# Patient Record
Sex: Female | Born: 1955 | Hispanic: No | Smoking: Current every day smoker
Health system: Southern US, Community
[De-identification: ages and names within clinical notes are randomized; demographics above are authoritative.]

## PROBLEM LIST (undated history)

## (undated) ENCOUNTER — Emergency Department (HOSPITAL_COMMUNITY): Payer: 59 | Source: Home / Self Care

## (undated) DIAGNOSIS — R079 Chest pain, unspecified: Secondary | ICD-10-CM

## (undated) DIAGNOSIS — K279 Peptic ulcer, site unspecified, unspecified as acute or chronic, without hemorrhage or perforation: Secondary | ICD-10-CM

## (undated) DIAGNOSIS — F329 Major depressive disorder, single episode, unspecified: Secondary | ICD-10-CM

## (undated) DIAGNOSIS — F419 Anxiety disorder, unspecified: Secondary | ICD-10-CM

## (undated) DIAGNOSIS — E785 Hyperlipidemia, unspecified: Secondary | ICD-10-CM

## (undated) DIAGNOSIS — R002 Palpitations: Secondary | ICD-10-CM

## (undated) DIAGNOSIS — F32A Depression, unspecified: Secondary | ICD-10-CM

## (undated) DIAGNOSIS — K219 Gastro-esophageal reflux disease without esophagitis: Secondary | ICD-10-CM

## (undated) DIAGNOSIS — R609 Edema, unspecified: Secondary | ICD-10-CM

## (undated) DIAGNOSIS — J449 Chronic obstructive pulmonary disease, unspecified: Secondary | ICD-10-CM

## (undated) DIAGNOSIS — I10 Essential (primary) hypertension: Secondary | ICD-10-CM

## (undated) DIAGNOSIS — M1990 Unspecified osteoarthritis, unspecified site: Secondary | ICD-10-CM

## (undated) DIAGNOSIS — R0600 Dyspnea, unspecified: Secondary | ICD-10-CM

## (undated) DIAGNOSIS — I517 Cardiomegaly: Secondary | ICD-10-CM

## (undated) HISTORY — DX: Dyspnea, unspecified: R06.00

## (undated) HISTORY — DX: Palpitations: R00.2

## (undated) HISTORY — PX: TUBAL LIGATION: SHX77

## (undated) HISTORY — PX: CARDIAC CATHETERIZATION: SHX172

## (undated) HISTORY — DX: Anxiety disorder, unspecified: F41.9

## (undated) HISTORY — DX: Depression, unspecified: F32.A

## (undated) HISTORY — DX: Hyperlipidemia, unspecified: E78.5

## (undated) HISTORY — DX: Peptic ulcer, site unspecified, unspecified as acute or chronic, without hemorrhage or perforation: K27.9

## (undated) HISTORY — DX: Major depressive disorder, single episode, unspecified: F32.9

## (undated) HISTORY — DX: Chest pain, unspecified: R07.9

## (undated) HISTORY — DX: Unspecified osteoarthritis, unspecified site: M19.90

## (undated) HISTORY — DX: Edema, unspecified: R60.9

---

## 2000-11-25 ENCOUNTER — Emergency Department (HOSPITAL_COMMUNITY): Admission: EM | Admit: 2000-11-25 | Discharge: 2000-11-25 | Payer: Self-pay | Admitting: *Deleted

## 2000-11-26 ENCOUNTER — Encounter: Payer: Self-pay | Admitting: *Deleted

## 2001-06-06 ENCOUNTER — Encounter: Payer: Self-pay | Admitting: Emergency Medicine

## 2001-06-06 ENCOUNTER — Emergency Department (HOSPITAL_COMMUNITY): Admission: EM | Admit: 2001-06-06 | Discharge: 2001-06-06 | Payer: Self-pay | Admitting: Emergency Medicine

## 2003-03-12 ENCOUNTER — Emergency Department (HOSPITAL_COMMUNITY): Admission: EM | Admit: 2003-03-12 | Discharge: 2003-03-12 | Payer: Self-pay | Admitting: Emergency Medicine

## 2003-03-16 ENCOUNTER — Ambulatory Visit (HOSPITAL_COMMUNITY): Admission: RE | Admit: 2003-03-16 | Discharge: 2003-03-16 | Payer: Self-pay | Admitting: Internal Medicine

## 2007-02-01 ENCOUNTER — Emergency Department (HOSPITAL_COMMUNITY): Admission: EM | Admit: 2007-02-01 | Discharge: 2007-02-01 | Payer: Self-pay | Admitting: Emergency Medicine

## 2009-03-18 ENCOUNTER — Ambulatory Visit: Payer: Self-pay | Admitting: Internal Medicine

## 2009-07-27 ENCOUNTER — Emergency Department (HOSPITAL_COMMUNITY): Admission: EM | Admit: 2009-07-27 | Discharge: 2009-07-27 | Payer: Self-pay | Admitting: Emergency Medicine

## 2010-03-20 ENCOUNTER — Emergency Department (HOSPITAL_COMMUNITY)
Admission: EM | Admit: 2010-03-20 | Discharge: 2010-03-20 | Payer: Self-pay | Source: Home / Self Care | Admitting: Emergency Medicine

## 2010-05-09 LAB — BASIC METABOLIC PANEL
BUN: 16 mg/dL (ref 6–23)
CO2: 28 mEq/L (ref 19–32)
Calcium: 9.2 mg/dL (ref 8.4–10.5)
Creatinine, Ser: 0.84 mg/dL (ref 0.4–1.2)
GFR calc Af Amer: >60 mL/min (ref >60)
Glucose, Bld: 96 mg/dL (ref 70–99)

## 2010-05-09 LAB — URINALYSIS, ROUTINE W REFLEX MICROSCOPIC
Bilirubin Urine: NEGATIVE
Glucose, UA: NEGATIVE mg/dL
Hgb urine dipstick: NEGATIVE
Ketones, ur: NEGATIVE mg/dL
Nitrite: NEGATIVE
Protein, ur: NEGATIVE mg/dL
Specific Gravity, Urine: 1.031 — ABNORMAL HIGH (ref 1.005–1.030)
Urobilinogen, UA: 1 mg/dL (ref 0.0–1.0)
pH: 6 (ref 5.0–8.0)

## 2010-05-09 LAB — CBC
MCHC: 33 g/dL (ref 30.0–36.0)
Platelets: 149 10*3/uL — ABNORMAL LOW (ref 150–400)
RBC: 4.8 MIL/uL (ref 3.87–5.11)
RDW: 16.1 % — ABNORMAL HIGH (ref 11.5–15.5)

## 2010-05-09 LAB — POCT CARDIAC MARKERS
CKMB, poc: <1 ng/mL — ABNORMAL LOW (ref 1.0–8.0)
Myoglobin, poc: 44.3 ng/mL (ref 12–200)

## 2010-05-09 LAB — DIFFERENTIAL
Basophils Absolute: 0 10*3/uL (ref 0.0–0.1)
Basophils Relative: 0 % (ref 0–1)
Eosinophils Absolute: 0.1 10*3/uL (ref 0.0–0.7)
Neutro Abs: 2.1 10*3/uL (ref 1.7–7.7)
Neutrophils Relative %: 58 % (ref 43–77)

## 2010-05-26 ENCOUNTER — Emergency Department (HOSPITAL_COMMUNITY)
Admission: EM | Admit: 2010-05-26 | Discharge: 2010-05-26 | Disposition: A | Discharge status: Home / Self Care | Payer: Self-pay | Attending: Emergency Medicine | Admitting: Emergency Medicine

## 2010-05-26 ENCOUNTER — Emergency Department (HOSPITAL_COMMUNITY): Payer: Self-pay

## 2010-05-26 DIAGNOSIS — I10 Essential (primary) hypertension: Secondary | ICD-10-CM | POA: Insufficient documentation

## 2010-05-26 DIAGNOSIS — R072 Precordial pain: Secondary | ICD-10-CM | POA: Insufficient documentation

## 2010-05-26 DIAGNOSIS — R0609 Other forms of dyspnea: Secondary | ICD-10-CM | POA: Insufficient documentation

## 2010-05-26 DIAGNOSIS — R1013 Epigastric pain: Secondary | ICD-10-CM | POA: Insufficient documentation

## 2010-05-26 DIAGNOSIS — R110 Nausea: Secondary | ICD-10-CM | POA: Insufficient documentation

## 2010-05-26 DIAGNOSIS — R002 Palpitations: Secondary | ICD-10-CM | POA: Insufficient documentation

## 2010-05-26 DIAGNOSIS — R109 Unspecified abdominal pain: Secondary | ICD-10-CM | POA: Insufficient documentation

## 2010-05-26 DIAGNOSIS — R0989 Other specified symptoms and signs involving the circulatory and respiratory systems: Secondary | ICD-10-CM | POA: Insufficient documentation

## 2010-05-26 DIAGNOSIS — R0602 Shortness of breath: Secondary | ICD-10-CM | POA: Insufficient documentation

## 2010-05-26 LAB — POCT CARDIAC MARKERS
CKMB, poc: <1 ng/mL — ABNORMAL LOW (ref 1.0–8.0)
Myoglobin, poc: 48.7 ng/mL (ref 12–200)
Troponin i, poc: <0.05 ng/mL (ref 0.00–0.09)

## 2010-05-26 LAB — POCT I-STAT, CHEM 8
BUN: 19 mg/dL (ref 6–23)
Calcium, Ion: 1.09 mmol/L — ABNORMAL LOW (ref 1.12–1.32)
Chloride: 105 meq/L (ref 96–112)
Creatinine, Ser: 1 mg/dL (ref 0.4–1.2)
Glucose, Bld: 89 mg/dL (ref 70–99)
HCT: 42 % (ref 36.0–46.0)
Hemoglobin: 14.3 g/dL (ref 12.0–15.0)
Potassium: 3.9 meq/L (ref 3.5–5.1)
Sodium: 141 meq/L (ref 135–145)
TCO2: 26 mmol/L (ref 0–100)

## 2010-05-26 LAB — HEPATIC FUNCTION PANEL
ALT: 14 U/L (ref 0–35)
AST: 22 U/L (ref 0–37)
Albumin: 3.7 g/dL (ref 3.5–5.2)
Alkaline Phosphatase: 78 U/L (ref 39–117)
Bilirubin, Direct: 0.3 mg/dL (ref 0.0–0.3)
Indirect Bilirubin: 0.4 mg/dL (ref 0.3–0.9)
Total Bilirubin: 0.7 mg/dL (ref 0.3–1.2)
Total Protein: 6.8 g/dL (ref 6.0–8.3)

## 2010-05-26 LAB — LIPASE, BLOOD: Lipase: 17 U/L (ref 11–59)

## 2010-06-01 ENCOUNTER — Inpatient Hospital Stay (HOSPITAL_COMMUNITY)
Admission: EM | Admit: 2010-06-01 | Discharge: 2010-06-02 | DRG: 392 | Disposition: A | Discharge status: Home / Self Care | Payer: Self-pay | Attending: Internal Medicine | Admitting: Internal Medicine

## 2010-06-01 ENCOUNTER — Emergency Department (HOSPITAL_COMMUNITY): Payer: Self-pay

## 2010-06-01 DIAGNOSIS — J449 Chronic obstructive pulmonary disease, unspecified: Secondary | ICD-10-CM | POA: Diagnosis present

## 2010-06-01 DIAGNOSIS — I10 Essential (primary) hypertension: Secondary | ICD-10-CM | POA: Diagnosis present

## 2010-06-01 DIAGNOSIS — R072 Precordial pain: Secondary | ICD-10-CM

## 2010-06-01 DIAGNOSIS — F1210 Cannabis abuse, uncomplicated: Secondary | ICD-10-CM | POA: Diagnosis present

## 2010-06-01 DIAGNOSIS — J4489 Other specified chronic obstructive pulmonary disease: Secondary | ICD-10-CM | POA: Diagnosis present

## 2010-06-01 DIAGNOSIS — Z87891 Personal history of nicotine dependence: Secondary | ICD-10-CM

## 2010-06-01 DIAGNOSIS — K219 Gastro-esophageal reflux disease without esophagitis: Principal | ICD-10-CM | POA: Diagnosis present

## 2010-06-01 DIAGNOSIS — E669 Obesity, unspecified: Secondary | ICD-10-CM | POA: Diagnosis present

## 2010-06-01 LAB — POCT CARDIAC MARKERS: Troponin i, poc: <0.05 ng/mL (ref 0.00–0.09)

## 2010-06-01 LAB — DIFFERENTIAL
Basophils Absolute: 0 K/uL (ref 0.0–0.1)
Basophils Relative: 1 % (ref 0–1)
Eosinophils Absolute: 0.2 10*3/uL (ref 0.0–0.7)
Eosinophils Relative: 5 % (ref 0–5)
Lymphocytes Relative: 33 % (ref 12–46)
Lymphs Abs: 1.1 10*3/uL (ref 0.7–4.0)
Monocytes Absolute: 0.3 10*3/uL (ref 0.1–1.0)
Monocytes Relative: 10 % (ref 3–12)
Neutro Abs: 1.8 10*3/uL (ref 1.7–7.7)
Neutrophils Relative %: 52 % (ref 43–77)

## 2010-06-01 LAB — CARDIAC PANEL(CRET KIN+CKTOT+MB+TROPI)
CK, MB: 0.7 ng/mL (ref 0.3–4.0)
Relative Index: INVALID (ref 0.0–2.5)
Relative Index: 0.6 (ref 0.0–2.5)
Total CK: 127 U/L (ref 7–177)
Troponin I: <0.01 ng/mL (ref 0.00–0.06)

## 2010-06-01 LAB — COMPREHENSIVE METABOLIC PANEL
ALT: 16 U/L (ref 0–35)
AST: 16 U/L (ref 0–37)
Albumin: 3.9 g/dL (ref 3.5–5.2)
Alkaline Phosphatase: 80 U/L (ref 39–117)
CO2: 29 mEq/L (ref 19–32)
Chloride: 104 mEq/L (ref 96–112)
GFR calc Af Amer: >60 mL/min (ref >60)
GFR calc non Af Amer: 56 mL/min — ABNORMAL LOW (ref >60)
Potassium: 4.1 mEq/L (ref 3.5–5.1)
Total Bilirubin: 0.3 mg/dL (ref 0.3–1.2)

## 2010-06-01 LAB — CBC
HCT: 41.4 % (ref 36.0–46.0)
Hemoglobin: 13.5 g/dL (ref 12.0–15.0)
MCH: 27.5 pg (ref 26.0–34.0)
MCHC: 32.6 g/dL (ref 30.0–36.0)
MCV: 84.3 fL (ref 78.0–100.0)
Platelets: 150 K/uL (ref 150–400)
RBC: 4.91 MIL/uL (ref 3.87–5.11)
RDW: 15.3 % (ref 11.5–15.5)
WBC: 3.4 K/uL — ABNORMAL LOW (ref 4.0–10.5)

## 2010-06-01 LAB — COMPREHENSIVE METABOLIC PANEL WITH GFR
BUN: 16 mg/dL (ref 6–23)
Calcium: 9.2 mg/dL (ref 8.4–10.5)
Creatinine, Ser: 1.02 mg/dL (ref 0.4–1.2)
Glucose, Bld: 99 mg/dL (ref 70–99)
Sodium: 140 meq/L (ref 135–145)
Total Protein: 7.4 g/dL (ref 6.0–8.3)

## 2010-06-01 LAB — LIPASE, BLOOD: Lipase: 18 U/L (ref 11–59)

## 2010-06-02 DIAGNOSIS — R079 Chest pain, unspecified: Secondary | ICD-10-CM

## 2010-06-02 LAB — CBC
MCV: 83.9 fL (ref 78.0–100.0)
Platelets: 164 10*3/uL (ref 150–400)
RBC: 5.08 MIL/uL (ref 3.87–5.11)
RDW: 15.2 % (ref 11.5–15.5)
WBC: 4.3 10*3/uL (ref 4.0–10.5)

## 2010-06-02 LAB — LIPID PANEL
HDL: 72 mg/dL (ref >39)
Total CHOL/HDL Ratio: 3.3 RATIO
Triglycerides: 75 mg/dL (ref <150)

## 2010-06-02 LAB — PROTIME-INR: Prothrombin Time: 13.1 seconds (ref 11.6–15.2)

## 2010-06-02 LAB — D-DIMER, QUANTITATIVE: D-Dimer, Quant: <0.22 ug/mL-FEU (ref 0.00–0.48)

## 2010-06-02 LAB — CARDIAC PANEL(CRET KIN+CKTOT+MB+TROPI)
CK, MB: 0.8 ng/mL (ref 0.3–4.0)
Troponin I: <0.01 ng/mL (ref 0.00–0.06)

## 2010-06-03 NOTE — Discharge Summary (Signed)
Jill Newton, Jill Newton            ACCOUNT NO.:  [id number]  MEDICAL RECORD NO.:  [id number]           PATIENT TYPE:  I  LOCATION:  3735                         FACILITY:  [facility]  PHYSICIAN:  Isidor Holts, M.D.  DATE OF BIRTH:  1955/03/08  DATE OF ADMISSION:  06/01/2010 DATE OF DISCHARGE:  06/02/2010                              DISCHARGE SUMMARY   PRIMARY MD:  [facility].  DISCHARGE DIAGNOSES: 1. Noncardiac chest pain, likely secondary to GERD. 2. Hypertension. 3. Smoking history. 4. History of headaches. 5. Remote history of peptic ulcer disease. 6. COPD.  DISCHARGE MEDICATIONS: 1. Albuterol inhaler 2 puffs p.r.n. q.4 hourly for shortness of     breath. 2. Hydrochlorothiazide 12.5 mg p.o. q.a.m. 3. Migraine formula (APAP/caffeine) OTC 1-2 tablets p.o. p.r.n. t.i.d.     for headache. 4. Prilosec 20 mg p.o. daily.  PROCEDURES:  Chest x-ray, June 01, 2010, this showed no pneumonia. There was mild peribronchial thickening.  CONSULTATIONS:  Dr. Lewayne Bunting, cardiologist.  ADMISSION HISTORY:  As in H and P notes of June 01, 2010, dictated by Dr. Donnalee Curry. However, in brief, this is a 55 year old female, with known history of headaches, remote history of peptic ulcer, smoking history, COPD, moderate obesity, presenting with recurrent chest pain. She was admitted for further evaluation, investigation, and management.  CLINICAL COURSE: 1. Chest pain.  The patient presented as described above.  12-lead EKG     showed no acute ischemic changes.  Cardiac enzymes were cycled,     remained unelevated.  D-dimer was negative, at less than 0.22.     Chest x-ray showed only mild bronchitic changes.  Cardiology     consultation was kindly provided by Dr. Lewayne Bunting.  The patient     went to cardiac catheterization on June 02, 2010, and this showed     no evidence of significant coronary artery disease.  The patient     has been reassured accordingly.  She has been  commenced on Prilosec     for probable GERD.  2. Hypertension.  The patient's blood pressure was controlled     throughout the course of her hospitalization.  3. Smoking history.  The patient smokes approximately half packet of     cigarettes per day.  She was counseled appropriately and has     expressed some determination to quit.  4. History of chronic headaches.  This did not prove problematic     during the course of this hospitalization.  5. COPD.  The patient was asymptomatic from this viewpoint.  DISPOSITION:  The patient as of June 02, 2010 was asymptomatic.  There were no new issues.  She was considered clinically stable for discharge, and therefore discharged accordingly.  ACTIVITY:  As tolerated.  DIET:  Heart-healthy.  FOLLOWUP INSTRUCTIONS:  The patient will follow up with her primary MD at [facility].  She already has an appointment scheduled for Jul 11, 2010, and she has been strongly urged to keep this appointment.     Isidor Holts, M.D.     CO/MEDQ  D:  06/02/2010  T:  06/03/2010  Job:  [id number] cc:   Primary  MD at [facility]  Electronically Signed by Isidor Holts M.D. on 06/03/2010 04:25:21 PM

## 2010-06-09 NOTE — Cardiovascular Report (Signed)
  NAMEDAJANA, GEHRIG            ACCOUNT NO.:  [id number]  MEDICAL RECORD NO.:  [id number]           PATIENT TYPE:  LOCATION:                                 FACILITY:  PHYSICIAN:  Trinh Sanjose C. Eden Emms, MD, FACCDATE OF BIRTH:  04-Jun-1955  DATE OF PROCEDURE: DATE OF DISCHARGE:                           CARDIAC CATHETERIZATION   Coronary arteriography.  INDICATIONS:  Chest pain.  Standard heart catheterization was done from the left radial artery.  A JL-3.5 and a JL-4 catheter were used to engage the left main.  A standard JR-4 catheter was used to engage the right coronary artery.  The patient received 4 mg of Versed and 25 mcg of fentanyl.  She also received 3 mg of verapamil and 5000 units of heparin.  Left main coronary artery was normal.  Left anterior descending artery was normal.  First and second diagonal branch were normal.  Circumflex coronary artery was nondominant.  It was normal.  First and second obtuse marginal branches were normal.  The right coronary artery was dominant.  It was normal.  RAO ventriculography:  RAO ventriculography was normal.  EF was 65%. There is no gradient across the aortic valve and no MR.  LV pressure was 165/13.  Aortic pressure was 165/92.  IMPRESSION:  The patient has no significant coronary artery disease. Her chest pain would appear to be noncardiac in etiology.  She tolerated the procedure well.  At the end of the procedure, a TR band was placed with good hemostasis.    Noralyn Pick. Eden Emms, MD, [facility]    PCN/MEDQ  D:  06/02/2010  T:  06/02/2010  Job:  [id number]  cc:   Learta Codding, MD,FACC  Electronically Signed by Charlton Haws MD [facility] on 06/09/2010 11:31:18 AM

## 2010-06-12 NOTE — H&P (Signed)
Jill Newton, Jill Newton            ACCOUNT NO.:  [id number]  MEDICAL RECORD NO.:  [id number]           PATIENT TYPE:  I  LOCATION:  3735                         FACILITY:  [facility]  PHYSICIAN:  Kela Millin, M.D.DATE OF BIRTH:  [birth year]  DATE OF ADMISSION:  06/01/2010 DATE OF DISCHARGE:                             HISTORY & PHYSICAL   CHIEF COMPLAINT:  Chest pain.  HISTORY OF PRESENT ILLNESS:  The patient is a 55 year old obese black female smoker with past medical history significant for recently diagnosed hypertension, who presents with above complaints.  She describes the chest pain as pressure that feels like someone sitting on her chest, midsternal in location, a 10/10 in intensity at its worst, and associated with nausea but no vomiting.  She states that the pain radiates to her upper back area and is not associated with diaphoresis. Jill Newton states that she does not own a car for transportation and has to walk most places and reports that she has noted that when she walks to the store that brings on the chest pain.  When asked about the duration of the pain she states that usually it lasts until she is able to lie down and rest and then it goes away.  She admits to shortness of breath during exertion.  She admits to a cough productive of phlegm, denies fevers, dysuria, melena, diarrhea and no hematochezia.  Per the ED physician, the patient had recently been seen in the ED and had an EKG done then which did not show any acute ischemic changes.  Upon coming back today, she had another EKG done and was noted to have some T- wave inversions in V5 and V6, otherwise normal EKG.  Point-of-care markers in the ED were negative x1 and a chest x-ray showed no pneumonia, mild peribronchial thickening.  Her electrolytes were within normal limits and she was admitted for further evaluation and management.  PAST MEDICAL HISTORY.:  As above. 1. History of headaches. 2.  History of ulcers.  MEDICATIONS: 1. Hydrochlorothiazide - started last Monday. 2. Ibuprofen.  ALLERGIES/INTOLERANCES:  NKDA.  SOCIAL HISTORY:  Positive for tobacco half a pack per day for about 25 years, occasional alcohol, she admits to marijuana use about once in 1-2 weeks.  REVIEW OF SYSTEMS:  As per HPI, other review of systems negative.  PHYSICAL EXAMINATION:  GENERAL:  The patient is a middle-aged black female, in no apparent distress, obese. VITAL SIGNS:  Her temperature is 129/68 with a pulse of 63, respiratory rate of 18, O2 saturation of 100, temperature is 97.4. HEENT:  PERRL, EOMI, sclerae anicteric, moist mucous membranes and no oral exudates. NECK:  Supple, no adenopathy, no thyromegaly and no JVD. LUNGS:  Coarse breath sounds, moderate air movements, no wheezes. CARDIOVASCULAR:  Regular rate and rhythm.  Normal S1-S2. ABDOMEN:  Obese, soft, bowel sounds present, nontender, nondistended. No organomegaly and no masses palpable.  Epigastric tenderness present. EXTREMITIES:  - No cyanosis and no edema. NEURO:  - She is alert and oriented x3.  Cranial nerves II-XII grossly intact.  Nonfocal exam.  LABORATORY DATA:  As per HPI.  Also, the white cell  count is 3.4 with a hemoglobin of 13.5, hematocrit of 41.4, platelet count is 150, neutrophil count is 52%.  Sodium is 140 with a potassium of 4.1, chloride 104, CO2 of 29, glucose is 99, BUN 16, creatinine is 1.02, calcium is 9.2, total protein is 7.4, albumin is 3.9, AST is 16.  Her lipase is 18.  ASSESSMENT AND PLAN: 1. Chest pain - probable unstable angina, as discussed above the pain     worse with exertion and relieved with rest and has increased in     frequency over the past 3 weeks.  We will place on aspirin,     nitroglycerin, beta-blockers, therapeutic Lovenox.  I have     consulted Cardiology.  The patient noted to have epigastric     tenderness on exam and her ED records lists ibuprofen on her      medication list.  We will start the patient on a PPI as well.     Follow. 2. Hypertension - we will place her on beta-blocker for now, follow     and resume outpatient medications upon discharge if appropriate. 3. Tobacco abuse and marijuana abuse - the patient counseled to quit,     social service consult for community resources. 4. History of headaches - follow pain management, follow and treat     accordingly.     Kela Millin, M.D.     ACV/MEDQ  D:  06/01/2010  T:  06/02/2010  Job:  [id number]  Electronically Signed by Donnalee Curry M.D. on 06/12/2010 08:27:50 AM

## 2010-06-18 NOTE — Consult Note (Signed)
NAMESHAINNA, FAUX            ACCOUNT NO.:  [id number]  MEDICAL RECORD NO.:  [id number]           PATIENT TYPE:  I  LOCATION:  3735                         FACILITY:  [facility]  PHYSICIAN:  Learta Codding, MD,FACC DATE OF BIRTH:  Jun 12, 1955  DATE OF CONSULTATION:  06/01/2010 DATE OF DISCHARGE:                                CONSULTATION   REFERRING PHYSICIAN:  [facility], clinical.  REASON FOR CONSULTATION:  Evaluation of substernal chest pain, both with typical and atypical features.  HISTORY OF PRESENT ILLNESS:  The patient is a 55 year old obese African American female with about 20-year history of tobacco use and recently diagnosed approximately a week ago with hypertension, started on hydrochlorothiazide.  The patient states that over the last 3 weeks she has had progressive substernal chest pain which she describes as a tightness which is left sided but can sometimes also be sharp in nature along the left sternal edge of her breast.  Exertion at times makes it worse and she has experienced it at rest, however, also.  On physical examination, she has significant tenderness upon palpation of this area.  She does have a history of reflux and a history of peptic ulcer disease but states that her symptoms are distinctly different.  She feels herself fatiguing very easily and when she does her grocery shopping, she sometimes has to stop because of substernal chest pain.  There is also some pleuritic component to her symptoms, particularly today.  On a couple of times when she takes a deep breath, her pain seems to be worse.  Her pain was not relieved by nitroglycerin in the emergency room but rather gave her migraine headache.  She did get morphine which she did not particularly like but it did appear to ease up the pain.  She had no acute EKG changes and her point-of-care markers were within normal limits with a troponin of less than 0.01.  The patient currently is pain  free but is still complaining of a headache.  FAMILY HISTORY:  Notable for heart disease in her mother although the patient could not be very specific about it, only that she thought that she had heart disease in her 69s, may be early 35s.  There is also diabetes mellitus in the family on her father's side.  She has no siblings with coronary artery disease.  ALLERGIES:  CODEINE.  MEDICATIONS:  Used to take Excedrin before it was taken off the market. She takes over-the-counter migraine medications.  She has an albuterol inhaler, and she was recently started on hydrochlorothiazide 12.5 mg daily when she was seen in the emergency room a week ago.  SOCIAL HISTORY:  The patient smokes typically a pack a day but has cut down to several cigarettes a day.  She does not drink alcohol.  She is married and has three children.  REVIEW OF SYSTEMS:  The patient denies any fever, chills, or sweats. She reports headache.  She has some dyspnea on exertion but no orthopnea, PND, edema, or palpitations.  The remainder of 18-point review of systems is, otherwise, within normal limits.  PAST MEDICAL HISTORY:  Notable for migraine  headaches, notable for recently diagnosed hypertension a week ago in the emergency room, obesity, and history of peptic ulcer disease status post upper endoscopy.  PHYSICAL EXAMINATION:  VITAL SIGNS:  Blood pressure 118/26 with a heart rate of 57 beats per minute, temperature is 98.9, saturation 99% on room air. GENERAL:  An obese African American female, somewhat uncomfortable, complaining of a headache but no chest pain. HEENT:  [place]/AT.  PERRLA.  EOMI.  Oropharynx clear without erythema or exudate. NECK:  Supple and there are no carotid bruits.  JVD is 5 cm.  No lymphadenopathy. HEART:  Regular rate and rhythm.  Normal S1 and S2 and no pathological murmurs. LUNGS:  Clear breath sounds bilaterally. SKIN:  No rash or lesions. ABDOMEN:  Soft, nontender with no rebound  or guarding.  Good bowel sounds. GENITOURINARY AND RECTAL:  Deferred. EXTREMITIES:  No cyanosis, clubbing, or edema.  No rash or lesions or petechiae. MUSCULOSKELETAL:  No joint deformities or effusions. NEUROLOGIC:  Alert and oriented grossly, nonfocal. SKIN:  Warm and dry and peripheral pulses are intact, dorsalis pedis and posterior tibial pulses as well as femoral pulses bilaterally.  Chest x-ray, mild peribronchial thickening. EKG:  Heart rate 63 beats per minute, normal sinus rhythm with nonspecific ST-T wave changes but otherwise, no evidence of acute ischemic changes.  Normal intervals.  LABORATORY WORK:  Hemoglobin 13.5, hematocrit 41.4, white count 3.4, platelet count 150.  Sodium is 140, potassium is 4.1, BUN is 16, creatinine is 1.02, glucose is 99.  Troponin is less than 0.01.  PROBLEM LIST: 1. Substernal chest pain with typical and atypical features.     a.     Cardiac risk factors including hypertension, possible family      history, and tobacco use. 2. Differential diagnosis includes:     a.     Acute coronary syndrome/unstable angina.     b.     Rule pulmonary embolism, very unlikely with essentially low      Wells criteria.     c.     Tietze syndrome/musculoskeletal pain.     d.     Esophageal spasm with gastroesophageal reflux disease. 3. Migraine headaches.  PLAN: 1. The patient has both typical and atypical features for angina.  Her     pain appears to be worse on exertion, and there is some relief with     rest.  The symptoms have also been crescendo over the last 3 weeks     which has prompted an emergency room visit last week.  However,     nitroglycerin does not seem to resolve her pain.  There were no     acute EKG changes, and her first set of troponins were within     normal limits. 2. I had a long discussion with the patient about the best strategy to     approach her problem.  Although clinically I feel that she likely     has musculoskeletal  pain as I could clearly reproduce it by     palpation of her left sternal border.  The patient is not able to     tolerate much in the way of pain medications, particularly codeine     and now also morphine.  She is very concerned about these symptoms.     Also given her size, I feel an exercise Cardiolite study may be     equivocal and may lead to cumulative testing.  Therefore, I     discussed with  the patient the possibility of a cardiac     catheterization.  I carefully explained the risks involved with the     procedure and the potential benefits.  She was very comfortable     with this discussion and wants this problem resolved as soon as     possible as she is very concerned that she might have heart     disease.  She is willing to proceed with cardiac catheterization     which will be performed tomorrow. 3. The patient will get a D-dimer although her Wells criteria is very     low making pulmonary embolism extremely unlikely. 4. The patient has some nausea.  We will treat her with Zofran.  Also,     she can be given tramadol for her headache in conjunction with     Tylenol which is a potentiating combination for analgesia control. 5. Serial cardiac enzyme markers will be obtained in the interim, but     I do not think the patient needs to be started on heparin at this     point in time and also she does not need nitroglycerin as she is     pain free and this seemed to be causing her significant headaches.     In addition, she is relatively bradycardic and we will defer beta-     blocker at the present time.     Learta Codding, MD,FACC     GED/MEDQ  D:  06/01/2010  T:  06/02/2010  Job:  [id number]  Electronically Signed by Lewayne Bunting MDFACC on 06/18/2010 03:21:25 PM

## 2010-07-08 NOTE — Op Note (Signed)
NAME:  Jill Newton, Jill Newton                      ACCOUNT NO.:  [id number]   MEDICAL RECORD NO.:  [id number]                   PATIENT TYPE:  AMB   LOCATION:  DAY                                  FACILITY:  [facility]   PHYSICIAN:  R. Roetta Sessions, M.D.              DATE OF BIRTH:  28-Oct-1955   DATE OF PROCEDURE:  03/16/2003  DATE OF DISCHARGE:                                 OPERATIVE REPORT   PROCEDURE:  Colonoscopy with ileoscopy and biopsy.   INDICATIONS:  The patient is a 55 year old lady who suffered a bout of  abdominal cramping and a couple of days worth of nonbloody diarrhea followed  by gross blood per rectum with clots last week.  She has remained  hemodynamically stable.  She was seen in the ER last week.  Her hemoglobin  was 12.6.  Her symptoms tapered off rapidly.  Her hemoglobin is 11.5 today.  She had no passage of blood during her colonoscopy prep yesterday.  Colonoscopy is now being done to further evaluate her symptoms.  This  approach has been discussed with the patient at length.  The potential  risks, benefits and alternatives have been reviewed.  Please see  documentation in the medical record.   DESCRIPTION OF PROCEDURE:  Oxygen saturation, blood pressure, pulse and  respiration were monitored throughout the entire procedure.  Conscious  sedation with Versed 5 mg IV and Demerol 75 mg IV in divided doses.  The  instrument was the Olympus video tip adult colonoscope.   FINDINGS:  Digital rectal exam revealed no abnormalities.   ENDOSCOPIC FINDINGS:  Prep was marginal to adequate.  There was quite a bit  of semi-formed stool throughout the colon with some seeds which may exam  difficult and kept clotting the scope.   Rectum:  Examination of the rectum mucosa including retroflexion in the anal  verge revealed only anal papilla and internal hemorrhoids.  Colon:  Colonic mucosa was surveyed from the rectosigmoid junction thru the  left, transverse, right colon to the  area of the appendiceal orifice,  ileocecal valve and cecum.  These structures were well seen and photographed  for the record.  From this level, the scope was slowly withdrawn.  All  previously mentioned mucosal surfaces were again seen.  The terminal ileum  was intubated to 10 cm.  The terminal ileum appeared normal.  The only  colonic mucosal abnormality noted was in the area of the splenic flexure  over approximately 10-15 cm.  There were areas of patchy granulation to  superficial erosion.  There was no ulcer, polyp or other abnormality.  A  couple of the abnormal areas were biopsied for histologic study.  The  patient tolerated the procedure well and was reactive after endoscopy.   ASSESSMENT:  1. Anal papilla and internal hemorrhoids.  Otherwise normal rectum.  2. Patchy granularity, superficial erosions involving 15 cm segment of     mucosa spanning  the splenic flexure, biopsied (most consistent with     ischemic colitis).  3. The remainder of the colonic mucosa appeared normal.  Normal terminal     ileum.   IMPRESSION:  The patient had a recent bout of ischemic or segmental colitis.   RECOMMENDATIONS:  1. Stop smoking.  2. Unless she has recurrent symptoms, no further evaluation is warranted.  3. She should have another colonoscopy for colorectal cancer screening     purposes in 10 years.  4. She is to let me know if she has any future bowel problems.      ___________________________________________                                            Jonathon Bellows, M.D.   RMR/MEDQ  D:  03/16/2003  T:  03/16/2003  Job:  [id number]

## 2010-07-08 NOTE — Consult Note (Signed)
NAME:  Jill Newton, Jill Newton                      ACCOUNT NO.:  [id number]   MEDICAL RECORD NO.:  [id number]                   PATIENT TYPE:  EMS   LOCATION:  ED                                   FACILITY:  [facility]   PHYSICIAN:  R. Roetta Sessions, M.D.              DATE OF BIRTH:  [birth year]   DATE OF CONSULTATION:  03/13/2003  DATE OF DISCHARGE:  03/12/2003                                   CONSULTATION   PHYSICIAN REQUESTING CONSULTATION:  [facility].   REASON FOR CONSULTATION:  Bloody diarrhea.   HISTORY OF PRESENT ILLNESS:  Jill Newton is a 55 year old black female who woke  up at 2 a.m. day before yesterday with severe abdominal cramping followed by  bloody diarrhea.  Symptoms persisted for several hours and she eventually  presented to the emergency department.  She states that they did an NG tube  for gastric lavage, however, she is unsure whether the Gastroccult was  positive.  She had several bloody bowel movements while she was in the ED.  Blood work including LFTs, lipase and amylase were all normal.  She had a  hemoglobin of 12.6, hematocrit 38.4, WBC 6.9 and platelets 182,000.  She  tells me she has been taking lots of Excedrin chronically for migraine  headaches.  No recent antibiotic use or travel abroad.  Generally, she has  regular bowel movements but on diarrhea.  She denies any melena.  No fever,  chills or heartburn.  She has had a couple of episodes of nausea and  vomiting but no hematemesis.  Denies any dizziness, lightheadedness,  shortness of breath or chest pain.  At this point, her last bowel movement  was more than 12 hours but there was no blood noted at that time.  She  continues to have some abdominal cramping, although Bentyl seems to be  helping.   CURRENT MEDICATIONS:  1. Bentyl 10 mg 1 q.6 h. p.r.n., started yesterday.  2. Excedrin p.r.n. headache -- may take up to 6 daily.   ALLERGIES:  No known drug allergies.   PAST MEDICAL HISTORY:  1.  History of migraine headaches previously for many years.  She most     recently has seen Dr. Mila Homer. Sudie Bailey and was put on Imitrex,     although has not seen him in several years.  2. She has had tubal ligation.  3. History of bronchitis.   FAMILY HISTORY:  Mother died of CHF.   SOCIAL HISTORY:  She is widowed and has 3 children.  She is disabled.  She  has a __________ , according to her, because she is a widow.  She smokes a  half a pack to 1 pack of cigarettes daily.  She drinks alcohol maybe once  every 2 weeks.   REVIEW OF SYSTEMS:  Please see HPI for GI, for GENERAL, for CARDIOPULMONARY.   PHYSICAL EXAMINATION:  VITAL SIGNS:  Weight 208-1/2.  Height 5 foot 4  inches.  Temperature 97.5, blood pressure 118/80, pulse 64.  GENERAL:  A pleasant, well-nourished, well-developed black female in no  acute distress.  She appears to not feel well.  SKIN:  Skin warm and dry.  No jaundice.  HEENT:  Conjunctivae are pink.  Sclerae are nonicteric.  Oropharyngeal  mucosa moist and pink.  No lesions, erythema or exudate.  NECK:  No lymphadenopathy or thyromegaly.  CHEST:  Lungs clear to auscultation.  CARDIAC:  Exam reveals regular rate and rhythm, normal S1 and S2, no  murmurs, rubs, or gallops.  ABDOMEN:  Positive bowel sounds.  Soft, nondistended.  She has mild diffuse  lower abdominal tenderness with some increased tenderness in the left lower  quadrant/left mid-abdomen.  No rebound tenderness or guarding.  No  organomegaly or masses.  RECTAL:  Examination reveals small external hemorrhoid.  No masses in the  rectal vault.  Secretions are Hemoccult-positive.  EXTREMITIES:  No edema.   IMPRESSION:  1. Jill Newton is a 55 year old lady who developed acute-onset abdominal cramping     followed by bloody diarrhea.  Symptoms are improving at this point,     however, she did continue to have some abdominal discomfort.     Differential diagnosis includes ischemic colitis, infectious colitis.   I     would not suspect upper gastrointestinal bleed, given hemodynamic     stability.  However, having said that, she has used a significant amount     of Excedrin chronically.  If this colonoscopy is unremarkable, she will     need to have an upper endoscopy.  2. History of chronic migraine headaches, using excessive Excedrin.  3. Hemoccult-positive stool.   PLAN:  1. Colonoscopy plus-or-minus EGD.  2. No NSAIDs or aspirin use.  3. Bland diet.  4. Aciphex 20 mg p.o. daily, #20 samples given.  5. Vicodin 5/500 mg, #21, p.o. 4 times daily p.r.n. with 0 refills.  6. I have asked her to follow up with Dr. Sudie Bailey regarding migraine     headaches.     ________________________________________  ___________________________________________  Tana Coast, P.AJonathon Bellows, M.D.   LL/MEDQ  D:  03/13/2003  T:  03/13/2003  Job:  [id number]   cc:   R. Roetta Sessions, M.D.  [address]  [place]  [place] [address]  Fax: [phone]   Mila Homer. Sudie Bailey, M.D.  [address] [place], [place] [address]  Fax: [phone]   Myrtis Ser, M.D.

## 2010-11-28 LAB — CBC
HCT: 38.1
Hemoglobin: 12.6
MCHC: 33.1
MCV: 83.3
Platelets: 175
RDW: 16.4 — ABNORMAL HIGH

## 2010-11-28 LAB — POCT CARDIAC MARKERS: CKMB, poc: <1 — ABNORMAL LOW

## 2010-11-28 LAB — BASIC METABOLIC PANEL
BUN: 23
CO2: 26
Chloride: 108
Glucose, Bld: 99
Potassium: 3.9
Sodium: 138

## 2011-03-10 ENCOUNTER — Emergency Department (HOSPITAL_COMMUNITY): Payer: Self-pay

## 2011-03-10 ENCOUNTER — Emergency Department (HOSPITAL_COMMUNITY)
Admission: EM | Admit: 2011-03-10 | Discharge: 2011-03-10 | Disposition: A | Discharge status: Home / Self Care | Payer: Self-pay | Attending: Emergency Medicine | Admitting: Emergency Medicine

## 2011-03-10 ENCOUNTER — Other Ambulatory Visit: Payer: Self-pay

## 2011-03-10 ENCOUNTER — Encounter (HOSPITAL_COMMUNITY): Payer: Self-pay | Admitting: *Deleted

## 2011-03-10 DIAGNOSIS — J449 Chronic obstructive pulmonary disease, unspecified: Secondary | ICD-10-CM | POA: Insufficient documentation

## 2011-03-10 DIAGNOSIS — F17200 Nicotine dependence, unspecified, uncomplicated: Secondary | ICD-10-CM | POA: Insufficient documentation

## 2011-03-10 DIAGNOSIS — I10 Essential (primary) hypertension: Secondary | ICD-10-CM | POA: Insufficient documentation

## 2011-03-10 DIAGNOSIS — Z79899 Other long term (current) drug therapy: Secondary | ICD-10-CM | POA: Insufficient documentation

## 2011-03-10 DIAGNOSIS — R071 Chest pain on breathing: Secondary | ICD-10-CM | POA: Insufficient documentation

## 2011-03-10 DIAGNOSIS — R110 Nausea: Secondary | ICD-10-CM | POA: Insufficient documentation

## 2011-03-10 DIAGNOSIS — K219 Gastro-esophageal reflux disease without esophagitis: Secondary | ICD-10-CM | POA: Insufficient documentation

## 2011-03-10 DIAGNOSIS — J4489 Other specified chronic obstructive pulmonary disease: Secondary | ICD-10-CM | POA: Insufficient documentation

## 2011-03-10 HISTORY — DX: Gastro-esophageal reflux disease without esophagitis: K21.9

## 2011-03-10 HISTORY — DX: Essential (primary) hypertension: I10

## 2011-03-10 HISTORY — DX: Cardiomegaly: I51.7

## 2011-03-10 HISTORY — DX: Chronic obstructive pulmonary disease, unspecified: J44.9

## 2011-03-10 LAB — COMPREHENSIVE METABOLIC PANEL
AST: 15 U/L (ref 0–37)
Albumin: 3.3 g/dL — ABNORMAL LOW (ref 3.5–5.2)
Alkaline Phosphatase: 68 U/L (ref 39–117)
BUN: 14 mg/dL (ref 6–23)
CO2: 24 mEq/L (ref 19–32)
Chloride: 105 mEq/L (ref 96–112)
Creatinine, Ser: 0.81 mg/dL (ref 0.50–1.10)
GFR calc non Af Amer: 80 mL/min — ABNORMAL LOW (ref >90)
Potassium: 4.3 mEq/L (ref 3.5–5.1)
Total Bilirubin: 0.3 mg/dL (ref 0.3–1.2)

## 2011-03-10 LAB — CARDIAC PANEL(CRET KIN+CKTOT+MB+TROPI)
Relative Index: INVALID (ref 0.0–2.5)
Total CK: 88 U/L (ref 7–177)

## 2011-03-10 LAB — CBC
HCT: 39.2 % (ref 36.0–46.0)
Hemoglobin: 13 g/dL (ref 12.0–15.0)
MCV: 84.7 fL (ref 78.0–100.0)
WBC: 3.2 10*3/uL — ABNORMAL LOW (ref 4.0–10.5)

## 2011-03-10 LAB — D-DIMER, QUANTITATIVE: D-Dimer, Quant: 0.32 ug/mL-FEU (ref 0.00–0.48)

## 2011-03-10 MED ORDER — KETOROLAC TROMETHAMINE 60 MG/2ML IM SOLN
60.0000 mg | Freq: Once | INTRAMUSCULAR | Status: AC
  Administered 2011-03-10: 60 mg via INTRAMUSCULAR

## 2011-03-10 MED ORDER — HYDROCODONE-ACETAMINOPHEN 5-500 MG PO TABS
1.0000 | ORAL_TABLET | Freq: Four times a day (QID) | ORAL | Status: AC | PRN
Start: 2011-03-10 — End: 2011-03-20

## 2011-03-10 MED ORDER — PANTOPRAZOLE SODIUM 40 MG PO TBEC: 40.0000 mg | DELAYED_RELEASE_TABLET | Freq: Every day | ORAL | Status: DC

## 2011-03-10 MED ORDER — KETOROLAC TROMETHAMINE 60 MG/2ML IM SOLN
INTRAMUSCULAR | Status: AC
  Filled 2011-03-10: qty 2

## 2011-03-10 NOTE — ED Notes (Signed)
Patient reports she is having right sided chest pain for 6 mths that has worsened.  She states it feels like there is an elephant on her chest.  Patient states she is short of breath and has nausea as well

## 2011-03-10 NOTE — ED Notes (Signed)
Pt states that she has been having chest pain for 6-7 months intermittently. She was here this past summer for the pain. States she was cath and no blockage found. The pain is mid chest and radiates to the R posterior shoulder. Pt's lungs are clear. Pain increases when taking deep breaths. Pain in decreased when she is supine.

## 2011-03-10 NOTE — ED Provider Notes (Addendum)
History     CSN: [id number]  Arrival date & time 03/10/11  1352   First MD Initiated Contact with Patient 03/10/11 1425      Chief Complaint  Patient presents with  . Chest Pain  . Nausea    (Consider location/radiation/quality/duration/timing/severity/associated sxs/prior treatment) Patient is a 56 y.o. female presenting with chest pain. The history is provided by the patient.  Chest Pain Pertinent negatives for primary symptoms include no fever, no shortness of breath, no palpitations and no abdominal pain.   pt c/o mid to sl right of center cp for past few months. Constant in past few days. Not pleuritic. Constant, dull. No radiation. Pt unaware of specific exacerbating or alleviating symptoms. No assoc sob, nv or diaphoresis. Denies cough or uri c/o. No chest wall injury or strain. No fam hx cad. No cocaine use. No leg pain or swelling. No dvt or pe hx. ?hx gerd. No abd pain. No hx gallstones. States symptoms not related to eating. No positional change.   Past Medical History  Diagnosis Date  . COPD (chronic obstructive pulmonary disease)   . Hypertension   . Enlarged heart   . GERD (gastroesophageal reflux disease)   . Migraine     Past Surgical History  Procedure Date  . Tubal ligation     No family history on file.  History  Substance Use Topics  . Smoking status: Current Everyday Smoker  . Smokeless tobacco: Not on file  . Alcohol Use: Yes    OB History    Grav Para Term Preterm Abortions TAB SAB Ect Mult Living                  Review of Systems  Constitutional: Negative for fever and chills.  HENT: Negative for neck pain.   Eyes: Negative for redness.  Respiratory: Negative for shortness of breath.   Cardiovascular: Positive for chest pain. Negative for palpitations and leg swelling.  Gastrointestinal: Negative for abdominal pain.  Genitourinary: Negative for flank pain.  Musculoskeletal: Negative for back pain.  Skin: Negative for rash.    Neurological: Negative for headaches.  Hematological: Does not bruise/bleed easily.  Psychiatric/Behavioral: Negative for confusion.    Allergies  Review of patient's allergies indicates no known allergies.  Home Medications   Current Outpatient Rx  Name Route Sig Dispense Refill  . ALBUTEROL SULFATE HFA 108 (90 BASE) MCG/ACT IN AERS Inhalation Inhale 2 puffs into the lungs 3 (three) times daily as needed. For shortness of breath    . CITALOPRAM HYDROBROMIDE 40 MG PO TABS Oral Take 80 mg by mouth daily.    Marland Kitchen HYDROCHLOROTHIAZIDE 25 MG PO TABS Oral Take 25 mg by mouth daily.    Marland Kitchen METOCLOPRAMIDE HCL 10 MG PO TABS Oral Take 10 mg by mouth daily.    Marland Kitchen PRILOSEC OTC PO Oral Take 1 tablet by mouth daily.    Marland Kitchen TIOTROPIUM BROMIDE MONOHYDRATE 18 MCG IN CAPS Inhalation Place 18 mcg into inhaler and inhale daily.    Marland Kitchen ZOLPIDEM TARTRATE 10 MG PO TABS Oral Take 10 mg by mouth at bedtime as needed. To help sleep.      BP 121/66  Pulse 59  Temp(Src) 97.7 F (36.5 C) (Oral)  Resp 16  Ht 5\' 4"  (1.626 m)  Wt 203 lb (92.08 kg)  BMI 34.84 kg/m2  SpO2 100%  Physical Exam  Nursing note and vitals reviewed. Constitutional: She is oriented to person, place, and time. She appears well-developed and well-nourished. No distress.  Eyes: Conjunctivae are normal. No scleral icterus.  Neck: Neck supple. No tracheal deviation present.  Cardiovascular: Normal rate, regular rhythm, normal heart sounds and intact distal pulses.  Exam reveals no gallop and no friction rub.   No murmur heard. Pulmonary/Chest: Effort normal and breath sounds normal. No respiratory distress. She exhibits tenderness.       Mid chest wall tenderness. No crepitus.   Abdominal: Soft. Normal appearance and bowel sounds are normal. She exhibits no distension and no mass. There is no tenderness. There is no rebound and no guarding.  Musculoskeletal: She exhibits no edema and no tenderness.  Neurological: She is alert and oriented to  person, place, and time.  Skin: Skin is warm and dry. No rash noted.       No skin changes or rash/shingles in area of pain  Psychiatric: She has a normal mood and affect.    ED Course  Procedures (including critical care time)   Labs Reviewed  COMPREHENSIVE METABOLIC PANEL  D-DIMER, QUANTITATIVE  CARDIAC PANEL(CRET KIN+CKTOT+MB+TROPI)  CBC   Results for orders placed during the hospital encounter of 03/10/11  COMPREHENSIVE METABOLIC PANEL      Component Value Range   Sodium 138  135 - 145 (mEq/L)   Potassium 4.3  3.5 - 5.1 (mEq/L)   Chloride 105  96 - 112 (mEq/L)   CO2 24  19 - 32 (mEq/L)   Glucose, Bld 84  70 - 99 (mg/dL)   BUN 14  6 - 23 (mg/dL)   Creatinine, Ser 1.61  0.50 - 1.10 (mg/dL)   Calcium 8.8  8.4 - 09.6 (mg/dL)   Total Protein 6.9  6.0 - 8.3 (g/dL)   Albumin 3.3 (*) 3.5 - 5.2 (g/dL)   AST 15  0 - 37 (U/L)   ALT 12  0 - 35 (U/L)   Alkaline Phosphatase 68  39 - 117 (U/L)   Total Bilirubin 0.3  0.3 - 1.2 (mg/dL)   GFR calc non Af Amer 80 (*) >90 (mL/min)   GFR calc Af Amer >90  >90 (mL/min)  D-DIMER, QUANTITATIVE      Component Value Range   D-Dimer, Quant 0.32  0.00 - 0.48 (ug/mL-FEU)  CARDIAC PANEL(CRET KIN+CKTOT+MB+TROPI)      Component Value Range   Total CK 88  7 - 177 (U/L)   CK, MB 1.4  0.3 - 4.0 (ng/mL)   Troponin I <0.30  <0.30 (ng/mL)   Relative Index RELATIVE INDEX IS INVALID  0.0 - 2.5   CBC      Component Value Range   WBC 3.2 (*) 4.0 - 10.5 (K/uL)   RBC 4.63  3.87 - 5.11 (MIL/uL)   Hemoglobin 13.0  12.0 - 15.0 (g/dL)   HCT 04.5  40.9 - 81.1 (%)   MCV 84.7  78.0 - 100.0 (fL)   MCH 28.1  26.0 - 34.0 (pg)   MCHC 33.2  30.0 - 36.0 (g/dL)   RDW 91.4  78.2 - 95.6 (%)   Platelets 156  150 - 400 (K/uL)   Dg Chest 2 View  03/10/2011  *RADIOLOGY REPORT*  Clinical Data: Right-sided chest pain  CHEST - 2 VIEW  Comparison: 06/01/2010  Findings: Lungs are clear. No pleural effusion or pneumothorax.  The heart is top normal in size.  Mild  degenerative changes of the visualized thoracolumbar spine.  IMPRESSION: No evidence of acute cardiopulmonary disease.  Original Report Authenticated By: Charline Bills, M.D.      MDM  Labs. Ecg. Cxr.  Reviewed nursing notes.  Prior charts reviewed, 4/12 cardiac cath neg for signif cad, pt felt to have non cardiac cp.     Date: 03/10/2011  Rate: 59  Rhythm: sinus bradycardia  QRS Axis: normal  Intervals: normal  ST/T Wave abnormalities: normal  Conduction Disutrbances:none  Narrative Interpretation:   Old EKG Reviewed: unchanged  After constant symptoms for days, ckmb/trop normal. cxr neg acute. ddimer normal. Pt states same symptoms prompted cardiac eval/cath 4/12 - no signif cad on cath.  Will rx pain, close pcp follow up. Continue prilosec. maalox as need.   Pt says has ride, did not drive. vicodin po. pepcid po.   Suzi Roots, MD 03/10/11 1538  Suzi Roots, MD 03/10/11 [phone]

## 2011-05-24 ENCOUNTER — Encounter (HOSPITAL_COMMUNITY): Payer: Self-pay | Admitting: Neurology

## 2011-05-24 ENCOUNTER — Other Ambulatory Visit: Payer: Self-pay

## 2011-05-24 ENCOUNTER — Emergency Department (HOSPITAL_COMMUNITY): Payer: Self-pay

## 2011-05-24 ENCOUNTER — Emergency Department (HOSPITAL_COMMUNITY)
Admission: EM | Admit: 2011-05-24 | Discharge: 2011-05-24 | Disposition: A | Discharge status: Home / Self Care | Payer: Self-pay | Attending: Emergency Medicine | Admitting: Emergency Medicine

## 2011-05-24 DIAGNOSIS — J4489 Other specified chronic obstructive pulmonary disease: Secondary | ICD-10-CM | POA: Insufficient documentation

## 2011-05-24 DIAGNOSIS — R079 Chest pain, unspecified: Secondary | ICD-10-CM | POA: Insufficient documentation

## 2011-05-24 DIAGNOSIS — I10 Essential (primary) hypertension: Secondary | ICD-10-CM | POA: Insufficient documentation

## 2011-05-24 DIAGNOSIS — K219 Gastro-esophageal reflux disease without esophagitis: Secondary | ICD-10-CM | POA: Insufficient documentation

## 2011-05-24 DIAGNOSIS — J449 Chronic obstructive pulmonary disease, unspecified: Secondary | ICD-10-CM | POA: Insufficient documentation

## 2011-05-24 DIAGNOSIS — Z79899 Other long term (current) drug therapy: Secondary | ICD-10-CM | POA: Insufficient documentation

## 2011-05-24 LAB — BASIC METABOLIC PANEL
CO2: 24 mEq/L (ref 19–32)
Chloride: 104 mEq/L (ref 96–112)
Sodium: 136 mEq/L (ref 135–145)

## 2011-05-24 LAB — CBC
Hemoglobin: 12 g/dL (ref 12.0–15.0)
MCV: 84.4 fL (ref 78.0–100.0)
Platelets: 153 10*3/uL (ref 150–400)
RBC: 4.37 MIL/uL (ref 3.87–5.11)
WBC: 3.4 10*3/uL — ABNORMAL LOW (ref 4.0–10.5)

## 2011-05-24 LAB — TROPONIN I: Troponin I: <0.3 ng/mL (ref <0.30)

## 2011-05-24 MED ORDER — MORPHINE SULFATE 4 MG/ML IJ SOLN
4.0000 mg | Freq: Once | INTRAMUSCULAR | Status: AC
  Administered 2011-05-24: 4 mg via INTRAVENOUS
  Filled 2011-05-24: qty 1

## 2011-05-24 MED ORDER — HYDROCODONE-ACETAMINOPHEN 5-325 MG PO TABS: 1.0000 | ORAL_TABLET | ORAL | Status: AC | PRN

## 2011-05-24 MED ORDER — ONDANSETRON HCL 4 MG/2ML IJ SOLN
4.0000 mg | Freq: Once | INTRAMUSCULAR | Status: AC
  Administered 2011-05-24: 4 mg via INTRAVENOUS
  Filled 2011-05-24: qty 2

## 2011-05-24 NOTE — ED Notes (Addendum)
Patient transported to X-ray 

## 2011-05-24 NOTE — Discharge Instructions (Signed)
Pain of Unknown Etiology (Pain Without a Known Cause) You have come to your caregiver because of pain. Pain can occur in any part of the body. Often there is not a definite cause. If your laboratory (blood or urine) work was normal and x-rays or other studies were normal, your caregiver may treat you without knowing the cause of the pain. An example of this is the headache. Most headaches are diagnosed by taking a history. This means your caregiver asks you questions about your headaches. Your caregiver determines a treatment based on your answers. Usually testing done for headaches is normal. Often testing is not done unless there is no response to medications. Regardless of where your pain is located today, you can be given medications to make you comfortable. If no physical cause of pain can be found, most cases of pain will gradually leave as suddenly as they came.  If you have a painful condition and no reason can be found for the pain, It is importantthat you follow up with your caregiver. If the pain becomes worse or does not go away, it may be necessary to repeat tests and look further for a possible cause.  Only take over-the-counter or prescription medicines for pain, discomfort, or fever as directed by your caregiver.   For the protection of your privacy, test results can not be given over the phone. Make sure you receive the results of your test. Ask as to how these results are to be obtained if you have not been informed. It is your responsibility to obtain your test results.   You may continue all activities unless the activities cause more pain. When the pain lessens, it is important to gradually resume normal activities. Resume activities by beginning slowly and gradually increasing the intensity and duration of the activities or exercise. During periods of severe pain, bed-rest may be helpful. Lay or sit in any position that is comfortable.   Ice used for acute (sudden) conditions may be  effective. Use a large plastic bag filled with ice and wrapped in a towel. This may provide pain relief.   See your caregiver for continued problems. They can help or refer you for exercises or physical therapy if necessary.  If you were given medications for your condition, do not drive, operate machinery or power tools, or sign legal documents for 24 hours. Do not drink alcohol, take sleeping pills, or take other medications that may interfere with treatment. See your caregiver immediately if you have pain that is becoming worse and not relieved by medications. Document Released: 11/01/2000 Document Revised: 01/26/2011 Document Reviewed: 02/06/2005 [employer] Patient Information 2012 [employer].Chest Pain (Nonspecific) It is often hard to give a specific diagnosis for the cause of chest pain. There is always a chance that your pain could be related to something serious, such as a heart attack or a blood clot in the lungs. You need to follow up with your caregiver for further evaluation. CAUSES   Heartburn.   Pneumonia or bronchitis.   Anxiety or stress.   Inflammation around your heart (pericarditis) or lung (pleuritis or pleurisy).   A blood clot in the lung.   A collapsed lung (pneumothorax). It can develop suddenly on its own (spontaneous pneumothorax) or from injury (trauma) to the chest.   Shingles infection (herpes zoster virus).  The chest wall is composed of bones, muscles, and cartilage. Any of these can be the source of the pain.  The bones can be bruised by injury.     The muscles or cartilage can be strained by coughing or overwork.   The cartilage can be affected by inflammation and become sore (costochondritis).  DIAGNOSIS  Lab tests or other studies, such as X-rays, electrocardiography, stress testing, or cardiac imaging, may be needed to find the cause of your pain.  TREATMENT   Treatment depends on what may be causing your chest pain. Treatment may include:    Acid blockers for heartburn.   Anti-inflammatory medicine.   Pain medicine for inflammatory conditions.   Antibiotics if an infection is present.   You may be advised to change lifestyle habits. This includes stopping smoking and avoiding alcohol, caffeine, and chocolate.   You may be advised to keep your head raised (elevated) when sleeping. This reduces the chance of acid going backward from your stomach into your esophagus.   Most of the time, nonspecific chest pain will improve within 2 to 3 days with rest and mild pain medicine.  HOME CARE INSTRUCTIONS   If antibiotics were prescribed, take your antibiotics as directed. Finish them even if you start to feel better.   For the next few days, avoid physical activities that bring on chest pain. Continue physical activities as directed.   Do not smoke.   Avoid drinking alcohol.   Only take over-the-counter or prescription medicine for pain, discomfort, or fever as directed by your caregiver.   Follow your caregiver's suggestions for further testing if your chest pain does not go away.   Keep any follow-up appointments you made. If you do not go to an appointment, you could develop lasting (chronic) problems with pain. If there is any problem keeping an appointment, you must call to reschedule.  SEEK MEDICAL CARE IF:   You think you are having problems from the medicine you are taking. Read your medicine instructions carefully.   Your chest pain does not go away, even after treatment.   You develop a rash with blisters on your chest.  SEEK IMMEDIATE MEDICAL CARE IF:   You have increased chest pain or pain that spreads to your arm, neck, jaw, back, or abdomen.   You develop shortness of breath, an increasing cough, or you are coughing up blood.   You have severe back or abdominal pain, feel nauseous, or vomit.   You develop severe weakness, fainting, or chills.   You have a fever.  THIS IS AN EMERGENCY. Do not wait to  see if the pain will go away. Get medical help at once. Call your local emergency services (911 in [place] Do not drive yourself to the hospital. MAKE SURE YOU:   Understand these instructions.   Will watch your condition.   Will get help right away if you are not doing well or get worse.  Document Released: 11/16/2004 Document Revised: 01/26/2011 Document Reviewed: 09/12/2007 [employer] Patient Information 2012 [employer]. 

## 2011-05-24 NOTE — ED Provider Notes (Signed)
Medical screening examination/treatment/procedure(s) were performed by non-physician practitioner and as supervising physician I was immediately available for consultation/collaboration.  Ethelda Chick, MD 05/24/11 1028

## 2011-05-24 NOTE — ED Provider Notes (Signed)
History     CSN: [id number]  Arrival date & time 05/24/11  0747   First MD Initiated Contact with Patient 05/24/11 [phone]      Chief Complaint  Patient presents with  . Chest Pain    (Consider location/radiation/quality/duration/timing/severity/associated sxs/prior treatment) HPI  Pt is here for chest pain times one year. She states she has come today because it is normally on her right side but now its on her left. She had a normal Cardiac Cath done on 06/02/2010. She took Diclofenac this morning and it did not help her pain. She tells me that her WBC are not working as they are supposed to and that she is being tested for Lupus but the results have not come back yet. In the past, when she would be seen for CP they gave her anti-inflammatories per patient.  PT did not see cardiologist referred to her last time because she didn't have insurance but she states she gets her [facility] card tomorrow.   Past Medical History  Diagnosis Date  . COPD (chronic obstructive pulmonary disease)   . Hypertension   . Enlarged heart   . GERD (gastroesophageal reflux disease)   . Migraine     Past Surgical History  Procedure Date  . Tubal ligation     No family history on file.  History  Substance Use Topics  . Smoking status: Current Everyday Smoker  . Smokeless tobacco: Not on file  . Alcohol Use: No    OB History    Grav Para Term Preterm Abortions TAB SAB Ect Mult Living                  Review of Systems  All other systems reviewed and are negative.    Allergies  Codeine  Home Medications   Current Outpatient Rx  Name Route Sig Dispense Refill  . ALBUTEROL SULFATE HFA 108 (90 BASE) MCG/ACT IN AERS Inhalation Inhale 2 puffs into the lungs 3 (three) times daily as needed. For shortness of breath    . CALCIUM CITRATE-VITAMIN D 200-200 MG-UNIT PO TABS Oral Take 1 tablet by mouth daily.    Marland Kitchen CITALOPRAM HYDROBROMIDE 40 MG PO TABS Oral Take 40 mg by mouth daily.       Marland Kitchen DICLOFENAC SODIUM 75 MG PO TBEC Oral Take 75 mg by mouth 2 (two) times daily.    Marland Kitchen LISINOPRIL-HYDROCHLOROTHIAZIDE 20-25 MG PO TABS Oral Take 1 tablet by mouth daily.    . MELOXICAM 7.5 MG PO TABS Oral Take 7.5 mg by mouth daily.    Marland Kitchen SIMVASTATIN 10 MG PO TABS Oral Take 10 mg by mouth at bedtime.    Marland Kitchen TIOTROPIUM BROMIDE MONOHYDRATE 18 MCG IN CAPS Inhalation Place 18 mcg into inhaler and inhale daily.    Marland Kitchen ZOLPIDEM TARTRATE 10 MG PO TABS Oral Take 10 mg by mouth at bedtime as needed. To help sleep.    Marland Kitchen HYDROCODONE-ACETAMINOPHEN 5-325 MG PO TABS Oral Take 1 tablet by mouth every 4 (four) hours as needed for pain. 10 tablet 0    BP 120/78  Pulse 45  Temp(Src) 98.8 F (37.1 C) (Oral)  Resp 16  SpO2 98%  Physical Exam  Nursing note and vitals reviewed. Constitutional: She appears well-developed and well-nourished. No distress.  HENT:  Head: Normocephalic and atraumatic.  Eyes: Pupils are equal, round, and reactive to light.  Neck: Normal range of motion. Neck supple.  Cardiovascular: Normal rate and regular rhythm.   Pulmonary/Chest: Effort normal.  She has no wheezes. She has no rales.  Abdominal: Soft.  Neurological: She is alert.  Skin: Skin is warm and dry.    ED Course  Procedures (including critical care time)  Labs Reviewed  CBC - Abnormal; Notable for the following:    WBC 3.4 (*)    All other components within normal limits  BASIC METABOLIC PANEL - Abnormal; Notable for the following:    GFR calc non Af Amer 76 (*)    GFR calc Af Amer 88 (*)    All other components within normal limits  TROPONIN I   Dg Chest 2 View  05/24/2011  *RADIOLOGY REPORT*  Clinical Data: Chest pain and tightness of chest  CHEST - 2 VIEW  Comparison: 03/10/2011  Findings: The heart size and mediastinal contours are within normal limits.  Both lungs are clear.  The visualized skeletal structures are unremarkable.  IMPRESSION: No active disease.  Original Report Authenticated By: Rosealee Albee, M.D.     1. Chest pain       MDM    Cath Lab Report  [id number]  Authenticated  Wendall Stade, MD       Signed by Wendall Stade, MD on 06/06/10 at 1151         Transcription Text    NAME: LORENE, KLIMAS ACCOUNT NO.: [id number]  MEDICAL RECORD NO.: [id number] PATIENT TYPE:  LOCATION: FACILITY:  PHYSICIAN: Noralyn Pick. Eden Emms, MD, FACCDATE OF BIRTH: 1955-06-30  DATE OF PROCEDURE:  DATE OF DISCHARGE:  CARDIAC CATHETERIZATION  Coronary arteriography.  INDICATIONS: Chest pain.  Standard heart catheterization was done from the left radial artery. A  JL-3.5 and a JL-4 catheter were used to engage the left main. A  standard JR-4 catheter was used to engage the right coronary artery.  The patient received 4 mg of Versed and 25 mcg of fentanyl. She also  received 3 mg of verapamil and 5000 units of heparin.  Left main coronary artery was normal.  Left anterior descending artery was normal.  First and second diagonal branch were normal.  Circumflex coronary artery was nondominant. It was normal. First and  second obtuse marginal branches were normal.  The right coronary artery was dominant. It was normal.  RAO ventriculography: RAO ventriculography was normal. EF was 65%.  There is no gradient across the aortic valve and no MR. LV pressure was  165/13. Aortic pressure was 165/92.  IMPRESSION: The patient has no significant coronary artery disease.  Her chest pain would appear to be noncardiac in etiology. She tolerated  the procedure well. At the end of the procedure, a TR band was placed  with good hemostasis.  Noralyn Pick. Eden Emms, MD, [facility]  PCN/MEDQ D: 06/02/2010 T: 06/02/2010 Job: [id number]  cc: Learta Codding, MD,FACC  Electronically Signed by Charlton Haws MD [facility] on 06/09/2010 11:31:18 AM         Date: 05/24/2011  Rate: 50  Rhythm: normal sinus rhythm  QRS Axis: normal  Intervals: normal  ST/T Wave abnormalities: normal  Conduction Disutrbances:none  Narrative  Interpretation: Early precordial R/S transition  Old EKG Reviewed: unchanged from Mar 10, 2011   Labs. Ecg. Cxr.  Reviewed nursing notes.  Prior charts reviewed, 4/12 cardiac cath neg for signif cad, pt felt to have non cardiac cp.  After constant symptoms for days, ckmb/trop normal. cxr neg acute. ddimer normal.  Pt states same symptoms prompted cardiac eval/cath 4/12 - no signif cad on cath.  Will rx pain, close pcp  follow up. Continue prilosec. maalox as need.  Pt says has ride, did not drive. vicodin po. pepcid po.   Pt has agreed to foll0w-up with cardiology this time now that she has insurance.  Pt has been advised of the symptoms that warrant their return to the ED. Patient has voiced understanding and has agreed to follow-up with the PCP or specialist.       Dorthula Matas, PA 05/24/11 1003

## 2011-05-24 NOTE — ED Notes (Signed)
PA aware of pt HR 45. Pt resting, asymptomatic

## 2011-05-24 NOTE — ED Notes (Signed)
Per ems- Pt walked from EMS stretcher to our stretcher without difficulty. Pt reprting last night at 10 pm started having left sided cp, pain is sharp. Reporting cough x weeks. EKG unremarkable. 99% RA, 52 HR, 148/78. PTAR gave 324 aspirin. A & O x 4. NAD.

## 2011-11-08 ENCOUNTER — Encounter (HOSPITAL_COMMUNITY): Payer: Self-pay | Admitting: Family Medicine

## 2011-11-08 ENCOUNTER — Emergency Department (HOSPITAL_COMMUNITY)
Admission: EM | Admit: 2011-11-08 | Discharge: 2011-11-08 | Disposition: A | Discharge status: Home / Self Care | Payer: Self-pay | Attending: Emergency Medicine | Admitting: Emergency Medicine

## 2011-11-08 ENCOUNTER — Emergency Department (HOSPITAL_COMMUNITY): Payer: Self-pay

## 2011-11-08 DIAGNOSIS — J4489 Other specified chronic obstructive pulmonary disease: Secondary | ICD-10-CM | POA: Insufficient documentation

## 2011-11-08 DIAGNOSIS — R079 Chest pain, unspecified: Secondary | ICD-10-CM | POA: Insufficient documentation

## 2011-11-08 DIAGNOSIS — J449 Chronic obstructive pulmonary disease, unspecified: Secondary | ICD-10-CM | POA: Insufficient documentation

## 2011-11-08 DIAGNOSIS — F17200 Nicotine dependence, unspecified, uncomplicated: Secondary | ICD-10-CM | POA: Insufficient documentation

## 2011-11-08 DIAGNOSIS — K219 Gastro-esophageal reflux disease without esophagitis: Secondary | ICD-10-CM | POA: Insufficient documentation

## 2011-11-08 DIAGNOSIS — R0602 Shortness of breath: Secondary | ICD-10-CM | POA: Insufficient documentation

## 2011-11-08 DIAGNOSIS — I10 Essential (primary) hypertension: Secondary | ICD-10-CM | POA: Insufficient documentation

## 2011-11-08 LAB — CBC WITH DIFFERENTIAL/PLATELET
Basophils Absolute: 0 10*3/uL (ref 0.0–0.1)
HCT: 42.5 % (ref 36.0–46.0)
Hemoglobin: 14.2 g/dL (ref 12.0–15.0)
Lymphocytes Relative: 30 % (ref 12–46)
Lymphs Abs: 1.2 10*3/uL (ref 0.7–4.0)
Monocytes Absolute: 0.3 10*3/uL (ref 0.1–1.0)
Monocytes Relative: 7 % (ref 3–12)
Neutro Abs: 2.6 10*3/uL (ref 1.7–7.7)
RBC: 5.09 MIL/uL (ref 3.87–5.11)
WBC: 4.2 10*3/uL (ref 4.0–10.5)

## 2011-11-08 LAB — POCT I-STAT TROPONIN I

## 2011-11-08 LAB — URINALYSIS, ROUTINE W REFLEX MICROSCOPIC
Bilirubin Urine: NEGATIVE
Hgb urine dipstick: NEGATIVE
Nitrite: NEGATIVE
Specific Gravity, Urine: 1.018 (ref 1.005–1.030)
pH: 5.5 (ref 5.0–8.0)

## 2011-11-08 LAB — COMPREHENSIVE METABOLIC PANEL
AST: 19 U/L (ref 0–37)
Albumin: 3.9 g/dL (ref 3.5–5.2)
Chloride: 104 mEq/L (ref 96–112)
Creatinine, Ser: 0.94 mg/dL (ref 0.50–1.10)
Total Bilirubin: 0.3 mg/dL (ref 0.3–1.2)

## 2011-11-08 LAB — TROPONIN I: Troponin I: <0.3 ng/mL (ref <0.30)

## 2011-11-08 MED ORDER — KETOROLAC TROMETHAMINE 30 MG/ML IJ SOLN
30.0000 mg | Freq: Once | INTRAMUSCULAR | Status: AC
  Administered 2011-11-08: 30 mg via INTRAVENOUS
  Filled 2011-11-08: qty 1

## 2011-11-08 MED ORDER — HYDROCODONE-ACETAMINOPHEN 5-325 MG PO TABS: 1.0000 | ORAL_TABLET | ORAL | Status: DC | PRN

## 2011-11-08 MED ORDER — SODIUM CHLORIDE 0.9 % IV BOLUS (SEPSIS)
1000.0000 mL | Freq: Once | INTRAVENOUS | Status: AC
  Administered 2011-11-08: 1000 mL via INTRAVENOUS

## 2011-11-08 NOTE — ED Notes (Signed)
Lab reporting 2nd blood sample hemolysis. Phlebotomy notified, drew 3rd set of blood

## 2011-11-08 NOTE — ED Provider Notes (Signed)
History     CSN: [id number]  Arrival date & time 11/08/11  1116   First MD Initiated Contact with Patient 11/08/11 1142      Chief Complaint  Patient presents with  . Chest Pain    (Consider location/radiation/quality/duration/timing/severity/associated sxs/prior treatment) HPI The patient presents with concerns of chest pain.  She notes that she has had similar episodes over the past year, and has been evaluated multiple times with catheterization, ultrasounds, x-rays.  She notes that today she notes similar pain, sternal, left-sided with no radiation.  The pain was dull.  No clear alleviating or exacerbating factors. She presented to her primary care physician's office, and when her blood pressure was found to be elevated with systolic greater than 200 she was referred here for further evaluation. She denies any new medication use, activities, smoking, drinking.  Past Medical History  Diagnosis Date  . COPD (chronic obstructive pulmonary disease)   . Hypertension   . Enlarged heart   . GERD (gastroesophageal reflux disease)   . Migraine     Past Surgical History  Procedure Date  . Tubal ligation     History reviewed. No pertinent family history.  History  Substance Use Topics  . Smoking status: Current Every Day Smoker  . Smokeless tobacco: Not on file  . Alcohol Use: No    OB History    Grav Para Term Preterm Abortions TAB SAB Ect Mult Living                  Review of Systems  Constitutional:       HPI  HENT:       HPI otherwise negative  Eyes: Negative.   Respiratory:       HPI, otherwise negative  Cardiovascular:       HPI, otherwise nmegative  Gastrointestinal: Negative for vomiting.  Genitourinary:       HPI, otherwise negative  Musculoskeletal:       HPI, otherwise negative  Skin: Negative.   Neurological: Negative for syncope.    Allergies  Codeine  Home Medications   Current Outpatient Rx  Name Route Sig Dispense Refill  .  ALBUTEROL SULFATE HFA 108 (90 BASE) MCG/ACT IN AERS Inhalation Inhale 2 puffs into the lungs 3 (three) times daily as needed. For shortness of breath    . CALCIUM CITRATE-VITAMIN D 200-200 MG-UNIT PO TABS Oral Take 1 tablet by mouth daily.      BP 138/77  Pulse 48  Temp 97.7 F (36.5 C) (Oral)  Resp 19  SpO2 98%  Physical Exam  Nursing note and vitals reviewed. Constitutional: She is oriented to person, place, and time. She appears well-developed and well-nourished. No distress.  HENT:  Head: Normocephalic and atraumatic.  Eyes: Conjunctivae normal and EOM are normal.  Cardiovascular: Normal rate and regular rhythm.   Pulmonary/Chest: Effort normal and breath sounds normal. No stridor. No respiratory distress.  Abdominal: She exhibits no distension.  Musculoskeletal: She exhibits no edema.  Neurological: She is alert and oriented to person, place, and time. No cranial nerve deficit.  Skin: Skin is warm and dry.  Psychiatric: She has a normal mood and affect.    ED Course  Procedures (including critical care time)  Labs Reviewed  CBC WITH DIFFERENTIAL - Abnormal; Notable for the following:    RDW 16.4 (*)     All other components within normal limits  URINALYSIS, ROUTINE W REFLEX MICROSCOPIC  POCT I-STAT TROPONIN I  COMPREHENSIVE METABOLIC PANEL  TROPONIN  I   Dg Chest 2 View  11/08/2011  *RADIOLOGY REPORT*  Clinical Data: Chest pain, chest tightness, shortness of breath, history smoking, COPD, hypertension, GERD  CHEST - 2 VIEW  Comparison: 05/24/2011  Findings: Upper-normal size of cardiac silhouette. Tortuous aorta. Pulmonary vascularity normal. Minimal bronchitic changes without infiltrate or pleural effusion. No pneumothorax. No acute osseous findings.  IMPRESSION: Minimal bronchitic changes.   Original Report Authenticated By: Lollie Marrow, M.D.      No diagnosis found.  Prior chart reviewed Cardiac 65 sinus rhythm normal Pulse ox 100% room air normal  Date:  11/08/2011  Rate: 65  Rhythm: normal sinus rhythm  QRS Axis: normal  Intervals: normal  ST/T Wave abnormalities: nonspecific T wave changes  Conduction Disutrbances:none  Narrative Interpretation:   Old EKG Reviewed: none available Borderline  MDM  The patient presents with concerns of ongoing chest pain.  Notably, the patient has a history of COPD and has been evaluated here several times in the past few months for similar chest pain.  On my exam she is in no distress with unremarkable vital signs.  A review of patient's chart to stricture catheterization 50 months ago with no noted coronary artery disease any normal ejection fraction.  The patient's evaluation here is largely reassuring, and given prescription of ongoing pain a negative troponin at the reflects the absence of ongoing coronary occlusion.  We discussed at length the need for ongoing follow up with her primary care physician, and consideration of additional medications for her COPD.  She was discharged in stable condition.  Gerhard Munch, MD 11/08/11 1544

## 2011-11-08 NOTE — ED Notes (Signed)
Blood hemolyzed, phlebotomy notified

## 2011-11-08 NOTE — ED Notes (Signed)
Pt reporting cp x several months been seen here so same. Reporting last night had left shoulder pain and cp which has been present for several months. [facility], and bp taken, was told systolic higher than 200, told to come here for evaluation. Pt a x 4.

## 2011-11-08 NOTE — ED Notes (Signed)
Pt requesting monitor cords and IV be removed as pt "is ready to go home".  RN discussed this with patient and IV an montior cords remain until MD is notified.

## 2012-10-15 ENCOUNTER — Encounter: Payer: Self-pay | Admitting: *Deleted

## 2012-10-15 ENCOUNTER — Encounter: Payer: Self-pay | Admitting: Cardiovascular Disease

## 2012-10-15 DIAGNOSIS — J449 Chronic obstructive pulmonary disease, unspecified: Secondary | ICD-10-CM | POA: Insufficient documentation

## 2012-10-15 DIAGNOSIS — I10 Essential (primary) hypertension: Secondary | ICD-10-CM | POA: Insufficient documentation

## 2012-10-15 DIAGNOSIS — R079 Chest pain, unspecified: Secondary | ICD-10-CM | POA: Insufficient documentation

## 2012-10-15 DIAGNOSIS — I517 Cardiomegaly: Secondary | ICD-10-CM | POA: Insufficient documentation

## 2012-10-15 DIAGNOSIS — K219 Gastro-esophageal reflux disease without esophagitis: Secondary | ICD-10-CM | POA: Insufficient documentation

## 2012-10-16 ENCOUNTER — Encounter: Payer: Self-pay | Admitting: Cardiovascular Disease

## 2012-10-16 ENCOUNTER — Ambulatory Visit (INDEPENDENT_AMBULATORY_CARE_PROVIDER_SITE_OTHER): Payer: Self-pay | Admitting: Cardiovascular Disease

## 2012-10-16 VITALS — BP 118/80 | HR 64 | Ht 64.0 in | Wt 212.0 lb

## 2012-10-16 DIAGNOSIS — R079 Chest pain, unspecified: Secondary | ICD-10-CM

## 2012-10-16 DIAGNOSIS — I10 Essential (primary) hypertension: Secondary | ICD-10-CM

## 2012-10-16 DIAGNOSIS — I517 Cardiomegaly: Secondary | ICD-10-CM

## 2012-10-16 NOTE — Assessment & Plan Note (Signed)
No objective evidence for this. CXR 4/13 normal cardiac sillouette with bronchitic changes No echo report in Epic  Will see what EF is on myovue

## 2012-10-16 NOTE — Assessment & Plan Note (Signed)
Atypical with history of normal cath. Abnormal ECG  F/u stress myovue.  Labs ESR, RF, ANA and TSH

## 2012-10-16 NOTE — Progress Notes (Signed)
Patient ID: Jill Newton, female   DOB: [birth year], 57 y.o.   MRN: [id number] 57 yo referred by [facility] for chest pain.  Pain is chronic.  It is weekly and almost daily.  She had a heart cath in 2012 which I performed that was normal with no CAD and no CE EF normal by v-gram.  Pain worse when she smokes She indicates quitting 6 months ago. But I have COPD she says.  Occasional pleuritic component to pain and feels better when she lays down. She says she may have been checked for lupus last year.  Takes BC powder for pain without complete relief.  Seen in [facility] April of 2013 for r/o but did not f/u with Cardiology due to lack of insurance.  No cough fever, weight loss rash or joint swelling   ROS: Denies fever, malais, weight loss, blurry vision, decreased visual acuity, cough, sputum, SOB, hemoptysis, pleuritic pain, palpitaitons, heartburn, abdominal pain, melena, lower extremity edema, claudication, or rash.  All other systems reviewed and negative   General: Affect appropriate Healthy:  appears stated age HEENT: normal Neck supple with no adenopathy JVP normal no bruits no thyromegaly Lungs clear with no wheezing and good diaphragmatic motion Heart:  S1/S2 no murmur,rub, gallop or click PMI normal Abdomen: benighn, BS positve, no tenderness, no AAA no bruit.  No HSM or HJR Distal pulses intact with no bruits No edema Neuro non-focal Skin warm and dry No muscular weakness  Medications Current Outpatient Prescriptions  Medication Sig Dispense Refill  . albuterol (PROVENTIL HFA;VENTOLIN HFA) 108 (90 BASE) MCG/ACT inhaler Inhale 2 puffs into the lungs 3 (three) times daily as needed. For shortness of breath      . calcium citrate-vitamin D 200-200 MG-UNIT TABS Take 1 tablet by mouth daily.      Marland Kitchen HYDROcodone-acetaminophen (NORCO/VICODIN) 5-325 MG per tablet Take 1 tablet by mouth every 4 (four) hours as needed for pain.  10 tablet  0  . lisinopril-hydrochlorothiazide  (PRINZIDE,ZESTORETIC) 20-25 MG per tablet Take 1 tablet by mouth daily.      . simvastatin (ZOCOR) 10 MG tablet Take 10 mg by mouth at bedtime.       No current facility-administered medications for this visit.    Allergies Codeine  Family History: Family History  Problem Relation Age of Onset  . CAD    . Diabetes      Social History: History   Social History  . Marital Status: Widowed    Spouse Name: N/A    Number of Children: N/A  . Years of Education: N/A   Occupational History  . Not on file.   Social History Main Topics  . Smoking status: Current Every Day Smoker  . Smokeless tobacco: Not on file  . Alcohol Use: No  . Drug Use: Yes    Special: Marijuana  . Sexual Activity:    Other Topics Concern  . Not on file   Social History Narrative  . No narrative on file    Electrocardiogram:   SR rate 64 nonspecific ST/T wave changes no change from 4/13    Assessment and Plan

## 2012-10-16 NOTE — Patient Instructions (Addendum)
Your physician recommends that you schedule a follow-up appointment in:  AS NEEDED  Your physician recommends that you continue on your current medications as directed. Please refer to the Current Medication list given to you today.  Your physician has requested that you have en exercise stress myoview. For further information please visit [email or web address] Please follow instruction sheet, as given.   Your physician recommends that you return for lab work in: TODAY  RF ANA  SED RATE  AND TSH

## 2012-10-16 NOTE — Assessment & Plan Note (Signed)
Well controlled.  Continue current medications and low sodium Dash type diet.    

## 2012-10-31 ENCOUNTER — Encounter (HOSPITAL_COMMUNITY): Payer: Self-pay

## 2012-11-13 ENCOUNTER — Encounter (HOSPITAL_COMMUNITY): Payer: Self-pay

## 2012-12-23 ENCOUNTER — Encounter (HOSPITAL_COMMUNITY): Payer: Self-pay | Admitting: Emergency Medicine

## 2012-12-23 ENCOUNTER — Emergency Department (HOSPITAL_COMMUNITY)
Admission: EM | Admit: 2012-12-23 | Discharge: 2012-12-23 | Disposition: A | Discharge status: Home / Self Care | Payer: Self-pay | Attending: Emergency Medicine | Admitting: Emergency Medicine

## 2012-12-23 DIAGNOSIS — F17200 Nicotine dependence, unspecified, uncomplicated: Secondary | ICD-10-CM | POA: Insufficient documentation

## 2012-12-23 DIAGNOSIS — T300 Burn of unspecified body region, unspecified degree: Secondary | ICD-10-CM

## 2012-12-23 DIAGNOSIS — Y92009 Unspecified place in unspecified non-institutional (private) residence as the place of occurrence of the external cause: Secondary | ICD-10-CM | POA: Insufficient documentation

## 2012-12-23 DIAGNOSIS — Z8719 Personal history of other diseases of the digestive system: Secondary | ICD-10-CM | POA: Insufficient documentation

## 2012-12-23 DIAGNOSIS — Z79899 Other long term (current) drug therapy: Secondary | ICD-10-CM | POA: Insufficient documentation

## 2012-12-23 DIAGNOSIS — J4489 Other specified chronic obstructive pulmonary disease: Secondary | ICD-10-CM | POA: Insufficient documentation

## 2012-12-23 DIAGNOSIS — T2122XA Burn of second degree of abdominal wall, initial encounter: Secondary | ICD-10-CM | POA: Insufficient documentation

## 2012-12-23 DIAGNOSIS — J449 Chronic obstructive pulmonary disease, unspecified: Secondary | ICD-10-CM | POA: Insufficient documentation

## 2012-12-23 DIAGNOSIS — X12XXXA Contact with other hot fluids, initial encounter: Secondary | ICD-10-CM | POA: Insufficient documentation

## 2012-12-23 DIAGNOSIS — I10 Essential (primary) hypertension: Secondary | ICD-10-CM | POA: Insufficient documentation

## 2012-12-23 DIAGNOSIS — T2124XA Burn of second degree of lower back, initial encounter: Secondary | ICD-10-CM | POA: Insufficient documentation

## 2012-12-23 DIAGNOSIS — E785 Hyperlipidemia, unspecified: Secondary | ICD-10-CM | POA: Insufficient documentation

## 2012-12-23 DIAGNOSIS — Y9389 Activity, other specified: Secondary | ICD-10-CM | POA: Insufficient documentation

## 2012-12-23 MED ORDER — MORPHINE SULFATE 4 MG/ML IJ SOLN
4.0000 mg | Freq: Once | INTRAMUSCULAR | Status: AC
  Administered 2012-12-23: 4 mg via INTRAVENOUS
  Filled 2012-12-23: qty 1

## 2012-12-23 MED ORDER — OXYCODONE-ACETAMINOPHEN 5-325 MG PO TABS: 2.0000 | ORAL_TABLET | ORAL | Status: DC | PRN

## 2012-12-23 MED ORDER — SILVER SULFADIAZINE 1 % EX CREA
TOPICAL_CREAM | Freq: Once | CUTANEOUS | Status: AC
  Administered 2012-12-23: 1 via TOPICAL
  Filled 2012-12-23: qty 85
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
## 2012-12-23 NOTE — ED Notes (Addendum)
Pt sent home with silvadene cream per EDP. Education provided. Sent home with gloves, gauze, tape.
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
## 2015-02-05 ENCOUNTER — Emergency Department (HOSPITAL_COMMUNITY)
Admission: EM | Admit: 2015-02-05 | Discharge: 2015-02-05 | Disposition: A | Discharge status: Home / Self Care | Payer: Medicaid Other | Attending: Emergency Medicine | Admitting: Emergency Medicine

## 2015-02-05 ENCOUNTER — Emergency Department (HOSPITAL_COMMUNITY): Payer: Medicaid Other

## 2015-02-05 ENCOUNTER — Encounter (HOSPITAL_COMMUNITY): Payer: Self-pay | Admitting: Emergency Medicine

## 2015-02-05 DIAGNOSIS — Z79899 Other long term (current) drug therapy: Secondary | ICD-10-CM | POA: Insufficient documentation

## 2015-02-05 DIAGNOSIS — E785 Hyperlipidemia, unspecified: Secondary | ICD-10-CM | POA: Diagnosis not present

## 2015-02-05 DIAGNOSIS — M25511 Pain in right shoulder: Secondary | ICD-10-CM | POA: Diagnosis not present

## 2015-02-05 DIAGNOSIS — I10 Essential (primary) hypertension: Secondary | ICD-10-CM | POA: Diagnosis not present

## 2015-02-05 DIAGNOSIS — J441 Chronic obstructive pulmonary disease with (acute) exacerbation: Secondary | ICD-10-CM | POA: Diagnosis not present

## 2015-02-05 DIAGNOSIS — F419 Anxiety disorder, unspecified: Secondary | ICD-10-CM | POA: Insufficient documentation

## 2015-02-05 DIAGNOSIS — M549 Dorsalgia, unspecified: Secondary | ICD-10-CM | POA: Insufficient documentation

## 2015-02-05 DIAGNOSIS — F17200 Nicotine dependence, unspecified, uncomplicated: Secondary | ICD-10-CM | POA: Diagnosis not present

## 2015-02-05 DIAGNOSIS — Z8719 Personal history of other diseases of the digestive system: Secondary | ICD-10-CM | POA: Insufficient documentation

## 2015-02-05 DIAGNOSIS — R079 Chest pain, unspecified: Secondary | ICD-10-CM | POA: Diagnosis present

## 2015-02-05 DIAGNOSIS — R0789 Other chest pain: Secondary | ICD-10-CM | POA: Diagnosis not present

## 2015-02-05 LAB — COMPREHENSIVE METABOLIC PANEL
ALK PHOS: 63 U/L (ref 38–126)
ALT: 15 U/L (ref 14–54)
ANION GAP: 5 (ref 5–15)
AST: 17 U/L (ref 15–41)
Albumin: 3.7 g/dL (ref 3.5–5.0)
BUN: 20 mg/dL (ref 6–20)
CHLORIDE: 111 mmol/L (ref 101–111)
CO2: 26 mmol/L (ref 22–32)
Calcium: 8.9 mg/dL (ref 8.9–10.3)
Creatinine, Ser: 1.29 mg/dL — ABNORMAL HIGH (ref 0.44–1.00)
GFR, EST AFRICAN AMERICAN: 51 mL/min — AB (ref >60)
GFR, EST NON AFRICAN AMERICAN: 44 mL/min — AB (ref >60)
Glucose, Bld: 112 mg/dL — ABNORMAL HIGH (ref 65–99)
Potassium: 4.7 mmol/L (ref 3.5–5.1)
Sodium: 142 mmol/L (ref 135–145)
Total Bilirubin: 0.4 mg/dL (ref 0.3–1.2)
Total Protein: 6.5 g/dL (ref 6.5–8.1)

## 2015-02-05 LAB — CBC WITH DIFFERENTIAL/PLATELET
Basophils Absolute: 0 10*3/uL (ref 0.0–0.1)
Basophils Relative: 0 %
EOS PCT: 2 %
Eosinophils Absolute: 0.1 10*3/uL (ref 0.0–0.7)
HEMATOCRIT: 39.6 % (ref 36.0–46.0)
HEMOGLOBIN: 12.6 g/dL (ref 12.0–15.0)
LYMPHS PCT: 26 %
Lymphs Abs: 1 10*3/uL (ref 0.7–4.0)
MCH: 27.8 pg (ref 26.0–34.0)
MCHC: 31.8 g/dL (ref 30.0–36.0)
MCV: 87.2 fL (ref 78.0–100.0)
Monocytes Absolute: 0.3 10*3/uL (ref 0.1–1.0)
Monocytes Relative: 9 %
NEUTROS ABS: 2.4 10*3/uL (ref 1.7–7.7)
Neutrophils Relative %: 64 %
PLATELETS: 120 10*3/uL — AB (ref 150–400)
RBC: 4.54 MIL/uL (ref 3.87–5.11)
RDW: 16.1 % — ABNORMAL HIGH (ref 11.5–15.5)
WBC: 3.7 10*3/uL — AB (ref 4.0–10.5)

## 2015-02-05 LAB — D-DIMER, QUANTITATIVE (NOT AT ARMC)

## 2015-02-05 LAB — LIPASE, BLOOD: LIPASE: 21 U/L (ref 11–51)

## 2015-02-05 LAB — I-STAT TROPONIN, ED: TROPONIN I, POC: 0 ng/mL (ref 0.00–0.08)

## 2015-02-05 MED ORDER — METHOCARBAMOL 500 MG PO TABS
1000.0000 mg | ORAL_TABLET | Freq: Once | ORAL | Status: AC
  Administered 2015-02-05: 1000 mg via ORAL
  Filled 2015-02-05: qty 2

## 2015-02-05 MED ORDER — IBUPROFEN 600 MG PO TABS: 600.0000 mg | ORAL_TABLET | Freq: Four times a day (QID) | ORAL | Status: DC | PRN

## 2015-02-05 MED ORDER — METHOCARBAMOL 500 MG PO TABS: 500.0000 mg | ORAL_TABLET | Freq: Three times a day (TID) | ORAL | Status: DC | PRN

## 2015-02-05 MED ORDER — IBUPROFEN 200 MG PO TABS
600.0000 mg | ORAL_TABLET | Freq: Once | ORAL | Status: AC
  Administered 2015-02-05: 600 mg via ORAL
  Filled 2015-02-05: qty 3

## 2015-02-05 NOTE — ED Provider Notes (Signed)
CSN: [id number]     Arrival date & time 02/05/15  [id number] History   First MD Initiated Contact with Patient 02/05/15 [phone]     Chief Complaint  Patient presents with  . Chest Pain     (Consider location/radiation/quality/duration/timing/severity/associated sxs/prior Treatment) HPI Patient has had several years of right-sided chest pain. She states this is had extensive workup in the past including cardiac catheterization a year ago that was normal. She states the last few months and is consistently worsened. The pain is in the right lower chest. She states it radiates to her back. It is worse with palpation and deep breathing. This associated with some shortness of breath. No fever or cough. No new lotions or swelling or pain. No family history of DVT/PE. No recent extended travel. Past Medical History  Diagnosis Date  . COPD (chronic obstructive pulmonary disease) (HCC)     Wheezing, SOB  . Hypertension   . Enlarged heart   . GERD (gastroesophageal reflux disease)   . Migraine   . Chest pain   . Dyspnea     When going up inclines  . Edema     In ankles. Since going off meds  . Palpitations   . Hyperlipidemia    Past Surgical History  Procedure Laterality Date  . Tubal ligation     Family History  Problem Relation Age of Onset  . CAD    . Diabetes     Social History  Substance Use Topics  . Smoking status: Current Every Day Smoker  . Smokeless tobacco: None  . Alcohol Use: No   OB History    No data available     Review of Systems  Constitutional: Negative for fever and chills.  Respiratory: Positive for shortness of breath. Negative for cough and chest tightness.   Cardiovascular: Positive for chest pain. Negative for leg swelling.  Gastrointestinal: Negative for nausea, vomiting, abdominal pain, diarrhea and constipation.  Genitourinary: Negative for dysuria, frequency and hematuria.  Musculoskeletal: Positive for myalgias and back pain. Negative for neck pain and  neck stiffness.  Skin: Negative for rash and wound.  Neurological: Negative for dizziness, weakness, light-headedness, numbness and headaches.  All other systems reviewed and are negative.     Allergies  Codeine  Home Medications   Prior to Admission medications   Medication Sig Start Date End Date Taking? Authorizing Provider  albuterol (PROVENTIL HFA;VENTOLIN HFA) 108 (90 BASE) MCG/ACT inhaler Inhale 2 puffs into the lungs 3 (three) times daily as needed. For shortness of breath   Yes Historical Provider, MD  ALPRAZolam Duanne Moron) 0.5 MG tablet Take 0.5 mg by mouth at bedtime as needed for anxiety.   Yes Historical Provider, MD  calcium citrate-vitamin D 200-200 MG-UNIT TABS Take 1 tablet by mouth daily.   Yes Historical Provider, MD  lisinopril-hydrochlorothiazide (PRINZIDE,ZESTORETIC) 20-25 MG per tablet Take 1 tablet by mouth daily.   Yes Historical Provider, MD  simvastatin (ZOCOR) 10 MG tablet Take 10 mg by mouth at bedtime.   Yes Historical Provider, MD  Tiotropium Bromide Monohydrate (SPIRIVA HANDIHALER IN) Inhale 1 capsule into the lungs daily.   Yes Historical Provider, MD  ibuprofen (ADVIL,MOTRIN) 600 MG tablet Take 1 tablet (600 mg total) by mouth every 6 (six) hours as needed. 02/05/15   Julianne Rice, MD  methocarbamol (ROBAXIN) 500 MG tablet Take 1 tablet (500 mg total) by mouth every 8 (eight) hours as needed for muscle spasms. 02/05/15   Julianne Rice, MD   BP 155/87 mmHg  Pulse 51  Temp(Src) 98.1 F (36.7 C) (Oral)  Resp 23  SpO2 100% Physical Exam  Constitutional: She is oriented to person, place, and time. She appears well-developed and well-nourished. No distress.  Anxious appearing  HENT:  Head: Normocephalic and atraumatic.  Mouth/Throat: Oropharynx is clear and moist. No oropharyngeal exudate.  Eyes: EOM are normal. Pupils are equal, round, and reactive to light.  Neck: Normal range of motion. Neck supple. No JVD present.  Cardiovascular: Normal rate  and regular rhythm.  Exam reveals no gallop and no friction rub.   No murmur heard. Pulmonary/Chest: Effort normal and breath sounds normal. No respiratory distress. She has no wheezes. She has no rales. She exhibits tenderness (chest tenderness reproduced with palpation along the right sternal border. No crepitus or deformity).  Abdominal: Soft. Bowel sounds are normal. She exhibits no distension and no mass. There is no tenderness. There is no rebound and no guarding.  Musculoskeletal: Normal range of motion. She exhibits tenderness. She exhibits no edema.  Patient has tenderness to palpation along the medial border of the right scapula. No definite CVA tenderness. No midline thoracic or lumbar tenderness. No evidence of trauma. No lower extremity swelling or pain. Distal pulses intact.  Neurological: She is alert and oriented to person, place, and time.  Moves all extremities without deficit. Sensation is fully intact.  Skin: Skin is warm and dry. No rash noted. No erythema.  Psychiatric: She has a normal mood and affect. Her behavior is normal.  Nursing note and vitals reviewed.   ED Course  Procedures (including critical care time) Labs Review Labs Reviewed  CBC WITH DIFFERENTIAL/PLATELET - Abnormal; Notable for the following:    WBC 3.7 (*)    RDW 16.1 (*)    Platelets 120 (*)    All other components within normal limits  COMPREHENSIVE METABOLIC PANEL - Abnormal; Notable for the following:    Glucose, Bld 112 (*)    Creatinine, Ser 1.29 (*)    GFR calc non Af Amer 44 (*)    GFR calc Af Amer 51 (*)    All other components within normal limits  D-DIMER, QUANTITATIVE (NOT AT [facility])  LIPASE, BLOOD  URINALYSIS, ROUTINE W REFLEX MICROSCOPIC (NOT AT [facility])  Randolm Idol, ED    Imaging Review Dg Chest 2 View  02/05/2015  CLINICAL DATA:  Right chest pain for 2-3 months. EXAM: CHEST  2 VIEW COMPARISON:  11/08/2011 FINDINGS: Mildly tortuous thoracic aorta. Mild enlargement of the  cardiopericardial silhouette, without edema. The lungs appear clear. No pleural effusion or pneumothorax. No rib fracture identified, although nondisplaced rib fractures can be occult on radiography. IMPRESSION: 1. A specific cause for the patient's right chest pain is not identified. 2. Chronic mild enlargement of the cardiopericardial silhouette. Electronically Signed   By: Van Clines M.D.   On: 02/05/2015 10:25   I have personally reviewed and evaluated these images and lab results as part of my medical decision-making.   EKG Interpretation   Date/Time:  Friday February 05 2015 09:00:59 EST Ventricular Rate:  63 PR Interval:  137 QRS Duration: 74 QT Interval:  471 QTC Calculation: 482 R Axis:   0 Text Interpretation:  Sinus rhythm Abnormal R-wave progression, early  transition Borderline T wave abnormalities Baseline wander in lead(s) II  III aVF Confirmed by Lita Mains  MD, Marise Knapper ([id number]) on 02/05/2015 9:23:45 AM      MDM   Final diagnoses:  Right-sided chest wall pain    Patient presents with  worsening chronic right chest pain. Pain has been present for several years. No acute abnormality of chest x-ray, EKG, troponin, d-dimer found to explain patient's symptoms. Pain is reproduced with palpation and likely chest wall etiology. Advised to follow-up with her primary physician. Return precautions given.    Julianne Rice, MD 02/05/15 [phone]

## 2015-02-05 NOTE — Discharge Instructions (Signed)
Chest Wall Pain Chest wall pain is pain in or around the bones and muscles of your chest. Sometimes, an injury causes this pain. Sometimes, the cause may not be known. This pain may take several weeks or longer to get better. HOME CARE INSTRUCTIONS  Pay attention to any changes in your symptoms. Take these actions to help with your pain:   Rest as told by your health care provider.   Avoid activities that cause pain. These include any activities that use your chest muscles or your abdominal and side muscles to lift heavy items.   If directed, apply ice to the painful area:  Put ice in a plastic bag.  Place a towel between your skin and the bag.  Leave the ice on for 20 minutes, 2-3 times per day.  Take over-the-counter and prescription medicines only as told by your health care provider.  Do not use tobacco products, including cigarettes, chewing tobacco, and e-cigarettes. If you need help quitting, ask your health care provider.  Keep all follow-up visits as told by your health care provider. This is important. SEEK MEDICAL CARE IF:  You have a fever.  Your chest pain becomes worse.  You have new symptoms. SEEK IMMEDIATE MEDICAL CARE IF:  You have nausea or vomiting.  You feel sweaty or light-headed.  You have a cough with phlegm (sputum) or you cough up blood.  You develop shortness of breath.   This information is not intended to replace advice given to you by your health care provider. Make sure you discuss any questions you have with your health care provider.   Document Released: 02/06/2005 Document Revised: 10/28/2014 Document Reviewed: 05/04/2014 Elsevier Interactive Patient Education 2016 Elsevier Inc. Chronic Pain Chronic pain can be defined as pain that is off and on and lasts for 3-6 months or longer. Many things cause chronic pain, which can make it difficult to make a diagnosis. There are many treatment options available for chronic pain. However, finding  a treatment that works well for you may require trying various approaches until the right one is found. Many people benefit from a combination of two or more types of treatment to control their pain. SYMPTOMS  Chronic pain can occur anywhere in the body and can range from mild to very severe. Some types of chronic pain include:  Headache.  Low back pain.  Cancer pain.  Arthritis pain.  Neurogenic pain. This is pain resulting from damage to nerves. People with chronic pain may also have other symptoms such as:  Depression.  Anger.  Insomnia.  Anxiety. DIAGNOSIS  Your health care provider will help diagnose your condition over time. In many cases, the initial focus will be on excluding possible conditions that could be causing the pain. Depending on your symptoms, your health care provider may order tests to diagnose your condition. Some of these tests may include:   Blood tests.   CT scan.   MRI.   X-rays.   Ultrasounds.   Nerve conduction studies.  You may need to see a specialist.  TREATMENT  Finding treatment that works well may take time. You may be referred to a pain specialist. He or she may prescribe medicine or therapies, such as:   Mindful meditation or yoga.  Shots (injections) of numbing or pain-relieving medicines into the spine or area of pain.  Local electrical stimulation.  Acupuncture.   Massage therapy.   Aroma, color, light, or sound therapy.   Biofeedback.   Working with a physical  therapist to keep from getting stiff.   Regular, gentle exercise.   Cognitive or behavioral therapy.   Group support.  Sometimes, surgery may be recommended.  HOME CARE INSTRUCTIONS   Take all medicines as directed by your health care provider.   Lessen stress in your life by relaxing and doing things such as listening to calming music.   Exercise or be active as directed by your health care provider.   Eat a healthy diet and include  things such as vegetables, fruits, fish, and lean meats in your diet.   Keep all follow-up appointments with your health care provider.   Attend a support group with others suffering from chronic pain. SEEK MEDICAL CARE IF:   Your pain gets worse.   You develop a new pain that was not there before.   You cannot tolerate medicines given to you by your health care provider.   You have new symptoms since your last visit with your health care provider.  SEEK IMMEDIATE MEDICAL CARE IF:   You feel weak.   You have decreased sensation or numbness.   You lose control of bowel or bladder function.   Your pain suddenly gets much worse.   You develop shaking.  You develop chills.  You develop confusion.  You develop chest pain.  You develop shortness of breath.  MAKE SURE YOU:  Understand these instructions.  Will watch your condition.  Will get help right away if you are not doing well or get worse.   This information is not intended to replace advice given to you by your health care provider. Make sure you discuss any questions you have with your health care provider.   Document Released: 10/29/2001 Document Revised: 10/09/2012 Document Reviewed: 08/02/2012 Elsevier Interactive Patient Education [employer].  Emergency Department Resource Guide 1) Find a Doctor and Pay Out of Pocket Although you won't have to find out who is covered by your insurance plan, it is a good idea to ask around and get recommendations. You will then need to call the office and see if the doctor you have chosen will accept you as a new patient and what types of options they offer for patients who are self-pay. Some doctors offer discounts or will set up payment plans for their patients who do not have insurance, but you will need to ask so you aren't surprised when you get to your appointment.  2) Contact Your Local Health Department Not all health departments have doctors that can  see patients for sick visits, but many do, so it is worth a call to see if yours does. If you don't know where your local health department is, you can check in your phone book. The CDC also has a tool to help you locate your state's health department, and many state websites also have listings of all of their local health departments.  3) Find a [facility] If your illness is not likely to be very severe or complicated, you may want to try a walk in clinic. These are popping up all over the country in pharmacies, drugstores, and shopping centers. They're usually staffed by nurse practitioners or physician assistants that have been trained to treat common illnesses and complaints. They're usually fairly quick and inexpensive. However, if you have serious medical issues or chronic medical problems, these are probably not your best option.  No Primary Care Doctor: - Call Health Connect at  [phone] - they can help you locate a primary care  doctor that  accepts your insurance, provides certain services, etc. - Physician Referral Service- [phone]  Chronic Pain Problems: Organization         Address  Phone   Notes  [facility]  [phone] Patients need to be referred by their primary care doctor.   Medication Assistance: Organization         Address  Phone   Notes  [facility] Medication [facility] [address] [place], [place] [address] [phone] --Must be a resident of [facility] -- Must have NO insurance coverage whatsoever (no Medicaid/ Medicare, etc.) -- The pt. MUST have a primary care doctor that directs their care regularly and follows them in the community   MedAssist  [phone]   [employer]  [phone]    Agencies that provide inexpensive medical care: Organization         Address  Phone   Notes  [place]  [phone]   [facility]    [phone]   [facility] [place], [place] [address] [phone]   [place] [address], [place] [phone]   Planned Parenthood    [phone]   [facility]    [phone]   [place] and [place] [address], [place] Phone:  [phone], Fax:  [phone] Hours of Operation:  9 am - 6 pm, M-F.  Also accepts Medicaid/Medicare and self-pay.  [facility] for [address], [address], [place] Phone: [phone], Fax: [phone]. Hours of Operation:  8:30 am - 5:30 pm, M-F.  Also accepts Medicaid and self-pay.  [facility] [address] [place] Phone: [phone]   [place], [place], [place] [phone], Ext. [phone] Mondays & Thursdays: 7-9 AM.  First 15 patients are seen on a first come, first serve basis.    [place] Providers:  Organization         Address  Phone   Notes  [facility] [address], [place] [phone] Also accepts self-pay patients.  [facility] [id number] [place], [place]  [phone]   [place], [address], [place] [phone]   [facility] [address] [place] [phone]   Lucianne Lei [address], [place]   [phone] Only accepts [place] Access [place] patients after they have their name applied to their card.   Self-Pay (no insurance) in [facility]:  Organization         Address  Phone   Notes  Sickle Cell Patients, [facility] Internal Medicine [place] [phone]   [facility]gent Care [place] [phone]   [facility] [place]  [place], [place],  [phone]   [facility]/Dr. Osei-Bonsu   [address] [place] or [address], [place] [phone] Phone number for both [place] and [place] locations is the same.  Urgent Medical and [facility] [address], [place] [phone]   [facility] [address] [place] or [address] [phone]  [facility] [phone], phone; [phone], fax Sees patients 1st and 3rd Saturday of every month.  Must not qualify for public or private insurance (i.e. Medicaid, Medicare, [place] Health Choice, Veterans' Benefits)  Household income should be no more than 200% of the poverty level The clinic cannot treat you if you are pregnant or think you are pregnant  Sexually transmitted diseases are not treated at the clinic.    Dental Care: Organization         Address  Phone  Notes  [facility] Department of [facility] [phone] Accepts children up to age 38 who are enrolled in [place] or [place]; pregnant women with a Medicaid card; and children who have applied for Medicaid or [place] Health Choice, but were declined, whose parents can pay a reduced fee at time of service.  [facility] Department of [facility]  [address], [place] [phone] Accepts children up to age 102 who are enrolled in [place] or [place]; pregnant women with a Medicaid card; and children who have applied for Medicaid or [place] Health Choice, but were declined, whose parents can pay a reduced fee at time of service.  [place] Adult Dental Access PROGRAM  [place] [phone] Patients are seen by appointment only. Walk-ins are not accepted. [place] will see patients 33 years of age and older. Monday - Tuesday (8am-5pm) Most Wednesdays (8:30-5pm) $30 per visit, cash only  [facility] Adult Dental Access  PROGRAM  [address], [facility] [phone] Patients are seen by appointment only. Walk-ins are not accepted. [place] will see patients 27 years of age and older. One Wednesday Evening (Monthly: Volunteer Based).  $30 per visit, cash only  [place]  [phone] for adults; Children under age 52, call Graduate Pediatric Dentistry at [phone]. Children aged 41-14, please call [phone] to request a pediatric application.  Dental services are provided in all areas of dental care including fillings, crowns and bridges, complete and partial dentures, implants, gum treatment, root canals, and extractions. Preventive care is also provided. Treatment is provided to both adults and children. Patients are selected via a lottery and there is often a waiting list.   [facility] [address], [place]  [phone] [email or web address]   [facility] [address] [place], [place] [phone], Ext. [phone] Second and Fourth Thursday of each month, opens at 6:30 AM; Clinic ends at 9 AM.  Patients are seen on a first-come first-served basis, and a limited number are seen during each clinic.   [facility]  [address] Hillard Danker [place], [place] [phone]   Eligibility Requirements You must have lived in [place], [place], or [place] counties for at least the last three months.   You cannot be eligible for state or federal sponsored [employer], including [employer], [place], or [employer].   You generally cannot be eligible for healthcare insurance through your employer.    How to apply: Eligibility screenings are held every Tuesday and Wednesday afternoon from 1:00 pm until 4:00 pm. You do not need an appointment for the interview!  [facility] [address], [place], [place]   [place]  [place] Department   [place]  Department  [phone]    Behavioral Health Resources in the Community: Intensive Outpatient Programs Organization         Address  Phone  Notes  [place]. [address] [place], [place] [phone]   [facility] Outpatient [address] [place], [place]   ADS: Alcohol & Drug Svcs [address] [place], [place]   [place] [address],  [place], [place] or [phone]   Substance Abuse Resources Organization         Address  Phone  Notes  Alcohol and Drug Services  [phone]   [place]  [phone]   [facility] [place]   Chinita Pester  [phone]   Residential & Outpatient Substance Abuse Program  [phone]   Psychological Services Organization         Address  Phone  Notes  [facility]  [place]  [phone]   [place] [address] [place] or [phone]    Mobile Crisis Teams Organization         Address  Phone  Notes  [facility], Mobile Crisis Care Unit  [phone]   [facility] Psychotherapeutic Services  [address] [place], [place]   [place] Levels [address] [place] [phone]    Self-Help/Support Groups Organization         Address  Phone             Notes  [place]. of [place] - variety of support groups  [place] Call for more information  Narcotics Anonymous (NA), [facility] [address], [employer] [place]  2 meetings at this location   Special educational needs teacher         Address  Phone  Notes  [facility],    [place]  [phone]   [facility]  [address] [place] [id number], [place], [place]   [place], [place] [phone] Admissions: 8am-3pm M-F  Incentives Substance [place] [address],    [place], [place] [phone]   [facility] [address] [place], [place], [place]   [facility] [address],  [place], [place]   [facility]ight Programs - Intensive Outpatient [address] Kristeen [address], [place], [place]   [facility] [place].,  [place], [place] [phone] or [phone]   [facility]idential Treatment Services (RTS) [address] [place], [place] Accepts Medicaid  [facility] [place] [address],  [place] [phone] Substance Abuse/Addiction Treatment   [facility] Organization         Address  Phone  Notes  [facility]  [phone]   Domenic Schwab, PhD [address] Arlis Porta [place], [place]   [phone] or [phone]   [place], [place] [phone]   [place] [address], [place], [place] [phone] Insurance/Medicaid/sponsorship through [employer] and Families [address] [place]                                    [place], [place] [phone] [place]  [place], [place] [phone]    Dr. Adele Schilder  [phone]   [facility] Dept. 1) [address], [place] 2) [place] 3)  [place] [address], [place] [phone]  [phone]   [place] [phone] or [phone] (After Hours)

## 2015-02-05 NOTE — ED Notes (Signed)
Patient transported to X-ray 

## 2015-02-05 NOTE — ED Notes (Signed)
Pt reports cp which is which is reproducible upon palpation. Pt alert x4. NAD at this time.

## 2016-06-02 ENCOUNTER — Ambulatory Visit (INDEPENDENT_AMBULATORY_CARE_PROVIDER_SITE_OTHER): Payer: Medicare Other | Admitting: Internal Medicine

## 2016-06-02 ENCOUNTER — Ambulatory Visit (INDEPENDENT_AMBULATORY_CARE_PROVIDER_SITE_OTHER)
Admission: RE | Admit: 2016-06-02 | Discharge: 2016-06-02 | Disposition: A | Discharge status: Home / Self Care | Payer: Medicare Other | Source: Ambulatory Visit | Attending: Internal Medicine | Admitting: Internal Medicine

## 2016-06-02 ENCOUNTER — Encounter: Payer: Self-pay | Admitting: Internal Medicine

## 2016-06-02 VITALS — BP 126/78 | HR 85 | Ht 64.0 in | Wt 204.0 lb

## 2016-06-02 DIAGNOSIS — I10 Essential (primary) hypertension: Secondary | ICD-10-CM

## 2016-06-02 DIAGNOSIS — J449 Chronic obstructive pulmonary disease, unspecified: Secondary | ICD-10-CM

## 2016-06-02 DIAGNOSIS — R079 Chest pain, unspecified: Secondary | ICD-10-CM | POA: Diagnosis not present

## 2016-06-02 DIAGNOSIS — F17210 Nicotine dependence, cigarettes, uncomplicated: Secondary | ICD-10-CM | POA: Diagnosis not present

## 2016-06-02 MED ORDER — PANTOPRAZOLE SODIUM 40 MG PO TBEC: 40.0000 mg | DELAYED_RELEASE_TABLET | Freq: Every day | ORAL | 2 refills | Status: DC

## 2016-06-02 MED ORDER — VALSARTAN-HYDROCHLOROTHIAZIDE 160-25 MG PO TABS: 1.0000 | ORAL_TABLET | Freq: Every day | ORAL | 11 refills | Status: DC

## 2016-06-02 MED ORDER — FAMOTIDINE 20 MG PO TABS: ORAL_TABLET | ORAL | 2 refills | Status: DC

## 2016-06-02 MED ORDER — VALSARTAN-HYDROCHLOROTHIAZIDE 160-25 MG PO TABS
1.0000 | ORAL_TABLET | Freq: Every day | ORAL | 11 refills | Status: DC
Start: 2016-06-02 — End: 2016-09-25

## 2016-06-02 NOTE — Patient Instructions (Addendum)
Stop lisinopril and spiriva as you do not have copd   Start diovan 160-25 one daily in place of lisinopril and use it daily perfectly regularly until you return and many of your symptoms should improve if not completely resolve   Pantoprazole (protonix) 40 mg   Take  30-60 min before first meal of the day and Pepcid (famotidine)  20 mg one @  bedtime until return to office - this is the best way to tell whether stomach acid is contributing to your problem.   GERD (REFLUX)  is an extremely common cause of respiratory symptoms just like yours , many times with no obvious heartburn at all.    It can be treated with medication, but also with lifestyle changes including elevation of the head of your bed (ideally with 6 inch  bed blocks),  Smoking cessation, avoidance of late meals, excessive alcohol, and avoid fatty foods, chocolate, peppermint, colas, red wine, and acidic juices such as orange juice.  NO MINT OR MENTHOL PRODUCTS SO NO COUGH DROPS   USE SUGARLESS CANDY INSTEAD (Jolley ranchers or Stover's or Life Savers) or even ice chips will also do - the key is to swallow to prevent all throat clearing. NO OIL BASED VITAMINS - use powdered substitutes.  The key is to stop smoking completely before smoking completely stops you!      Please schedule a follow up office visit in 4 weeks, sooner if needed  with all medications /inhalers/ solutions in hand so we can verify exactly what you are taking. This includes all medications from all doctors and over the counters

## 2016-06-02 NOTE — Assessment & Plan Note (Addendum)
In the best review of chronic cough to date ( NEJM 2016 375 [id number]) ,  ACEi are now felt to cause cough in up to  20% of pts which is a 4 fold increase from previous reports and does not include the variety of non-specific complaints we see in pulmonary clinic in pts on ACEi but previously attributed to another dx like  Copd/asthma and  include PNDS, throat and chest congestion, "bronchitis", unexplained dyspnea and noct "strangling" sensations, and hoarseness, but also  atypical /refractory GERD symptoms like dysphagia and "bad heartburn" and atypical cp syndromes   The only way I know  to prove this is not an "ACEi Case" is a trial off ACEi x a minimum of 6 weeks then regroup.   Try diovan 160-25 one daily

## 2016-06-02 NOTE — Progress Notes (Signed)
LMTCB

## 2016-06-02 NOTE — Progress Notes (Signed)
Subjective:    Patient ID: Jill Newton, female    DOB: Sep 08, 1955     MRN: [id number]  HPI  30 yobf active smoker with pattern of recurrent bronchitis esp winter with poor ex tolerance vs her peers but still could play volley ball and did fine with IUP's and then around 2012 noted more doe and cp with nl LHC and dx with copd rx with spiriva / albuterol no improvement then around spring 2017 onset of midline cp present constantly during the day, better if lies down referred to pulmonary clinic 06/02/2016 by Dr   Jill Newton   06/02/2016 1st [facility] office visit/ Jill Newton  Actively smoking/ on spiriva but not taking  Chief Complaint  Patient presents with  . Pulmonary Consult    referred by Dr. Alyson Newton, knot on mid sternum that radiates pain to her back, makes her chest feel heavy   xanax works best for the chronic midline cp, lying down down every time helps/ poking on sternum makes it worse but not deep breathing Doe = MMRC2 = can't walk a nl pace on a flat grade s sob but does fine slow and flat eg shopping   Cough is dry and daytime   No obvious other patterns in  day to day or daytime variability or assoc excess/ purulent sputum or mucus plugs or hemoptysis or cp or chest tightness, subjective wheeze or overt sinus or hb symptoms. No unusual exp hx or h/o childhood pna/ asthma or knowledge of premature birth.  Sleeping ok without nocturnal  or early am exacerbation  of respiratory  c/o's or need for noct saba. Also denies any obvious fluctuation of symptoms with weather or environmental changes or other aggravating or alleviating factors except as outlined above   Current Medications, Allergies, Complete Past Medical History, Past Surgical History, Family History, and Social History were reviewed in [employer] record.        Review of Systems  Constitutional: Negative for fever and unexpected weight change.  HENT: Negative for congestion, dental  problem, ear pain, nosebleeds, postnasal drip, rhinorrhea, sinus pressure, sneezing, sore throat and trouble swallowing.   Eyes: Negative for redness and itching.  Respiratory: Positive for cough and shortness of breath. Negative for chest tightness and wheezing.   Cardiovascular: Negative for palpitations and leg swelling.  Gastrointestinal: Negative for nausea and vomiting.  Genitourinary: Negative for dysuria.  Musculoskeletal: Negative for joint swelling.  Skin: Negative for rash.  Neurological: Positive for headaches.  Hematological: Does not bruise/bleed easily.  Psychiatric/Behavioral: Negative for dysphoric mood. The patient is nervous/anxious.        Objective:   Physical Exam  Animated but quite pleasant obese bf who failed to answer a single question asked in a straightforward manner, tending to go off on tangents (about her migraines) or answer questions with ambiguous medical terms or diagnoses and seemed perplexed  when asked the same question more than once for clarification.    Wt Readings from Last 3 Encounters:  06/02/16 204 lb (92.5 kg)  12/23/12 208 lb (94.3 kg)  10/16/12 212 lb (96.2 kg)    Vital signs reviewed  - Note on arrival 02 sats  100% on RA     HEENT: nl dentition, turbinates bilaterally, and oropharynx. Nl external ear canals without cough reflex   NECK :  without JVD/Nodes/TM/ nl carotid upstrokes bilaterally   LUNGS: no acc muscle use,  Nl contour chest which is clear to A and P bilaterally  without cough on insp or exp maneuvers - classic pseudowheezing  - cp reproduced in supine position with pressure over lower sternum just to the R of midline    CV:  RRR  no s3 or murmur or increase in P2, and no edema   ABD:  soft and nontender with nl inspiratory excursion in the supine position. No bruits or organomegaly appreciated, bowel sounds nl  MS:  Nl gait/ ext warm without deformities, calf tenderness, cyanosis or clubbing No obvious joint  restrictions   SKIN: warm and dry without lesions    NEURO:  alert, approp, nl sensorium with  no motor or cerebellar deficits apparent.      CXR PA and Lateral:   06/02/2016 :    I personally reviewed images and agree with radiology impression as follows:    No active cardiopulmonary disease.          Assessment & Plan:

## 2016-06-03 DIAGNOSIS — F17210 Nicotine dependence, cigarettes, uncomplicated: Secondary | ICD-10-CM | POA: Insufficient documentation

## 2016-06-03 NOTE — Assessment & Plan Note (Signed)

## 2016-06-03 NOTE — Assessment & Plan Note (Signed)
This has multiple atypical features but may either be mscp from the chronic cough she downplays or gerd or IBS (strongly suggested by absence supine, which also supports to some extent mscp) related but clearly not a cardiac or pulmonary source   For now rec max rx for gerd and focus on elimination of the cough and regroup in 4 weeks

## 2016-06-03 NOTE — Assessment & Plan Note (Addendum)
Spirometry 06/02/2016  FEV1 1.44 (70%)  Ratio 73 with mild curvature   Despite active smoking she really doesn't have significant copd at this point and the real need here is to focus on smoking cessation (see separate a/p) not pulmonary rx per se  - in fact spiriva may be contributing to cough so needs trial off   Total time devoted to counseling  > 50 % of initial 60 min office visit:  review case with pt/ discussion of options/alternatives/ personally creating written customized instructions  in presence of pt  then going over those specific  Instructions directly with the pt including how to use all of the meds but in particular covering each new medication in detail and the difference between the maintenance= "automatic" meds and the prns using an action plan format for the latter (If this problem/symptom => do that organization reading Left to right).  Please see AVS from this visit for a full list of these instructions which I personally wrote for this pt and  are unique to this visit.

## 2016-06-05 ENCOUNTER — Telehealth: Payer: Self-pay | Admitting: Internal Medicine

## 2016-06-05 MED ORDER — ALPRAZOLAM 0.5 MG PO TABS: 0.5000 mg | ORAL_TABLET | Freq: Every evening | ORAL | 0 refills | Status: DC | PRN

## 2016-06-05 NOTE — Telephone Encounter (Signed)
[phone] pt calling back she said leave a detailed msg not sure if signal will be come [facility] [address] even if its just some cause his office is gone

## 2016-06-05 NOTE — Telephone Encounter (Signed)
Rx called in x 1 only  Pt aware and nothing further needed

## 2016-06-05 NOTE — Telephone Encounter (Signed)
MW please advise on refill.  Thanks.  4/13 AVS-  Patient Instructions   Stop lisinopril and spiriva as you do not have copd    Start diovan 160-25 one daily in place of lisinopril and use it daily perfectly regularly until you return and many of your symptoms should improve if not completely resolve    Pantoprazole (protonix) 40 mg   Take  30-60 min before first meal of the day and Pepcid (famotidine)  20 mg one @  bedtime until return to office - this is the best way to tell whether stomach acid is contributing to your problem.    GERD (REFLUX)  is an extremely common cause of respiratory symptoms just like yours , many times with no obvious heartburn at all.     It can be treated with medication, but also with lifestyle changes including elevation of the head of your bed (ideally with 6 inch  bed blocks),  Smoking cessation, avoidance of late meals, excessive alcohol, and avoid fatty foods, chocolate, peppermint, colas, red wine, and acidic juices such as orange juice.  NO MINT OR MENTHOL PRODUCTS SO NO COUGH DROPS   USE SUGARLESS CANDY INSTEAD (Jolley ranchers or Stover's or Life Savers) or even ice chips will also do - the key is to swallow to prevent all throat clearing. NO OIL BASED VITAMINS - use powdered substitutes.   The key is to stop smoking completely before smoking completely stops you!      Please schedule a follow up office visit in 4 weeks, sooner if needed  with all medications /inhalers/ solutions in hand so we can verify exactly what you are taking. This includes all medications from all doctors and over the counters

## 2016-06-05 NOTE — Telephone Encounter (Signed)
Ok to refill x 30 pills only since takes at hs prn

## 2016-06-05 NOTE — Telephone Encounter (Signed)
LM x 1 
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
## 2016-06-06 NOTE — Progress Notes (Signed)
Spoke with pt and notified of results per Dr. Wert. Pt verbalized understanding and denied any questions. 

## 2016-06-26 ENCOUNTER — Telehealth: Payer: Self-pay | Admitting: Internal Medicine

## 2016-06-26 NOTE — Telephone Encounter (Signed)
Called and spoke to pt. Pt states she is having a discomfort when she swallows at her xiphoid process. Pt states this has been going on for several weeks. Advised pt to contact her PCP about the issue. Pt verbalized understanding and denied any further questions or concerns at this time.

## 2016-07-10 ENCOUNTER — Ambulatory Visit: Payer: Medicare Other | Admitting: Acute Care

## 2016-07-11 ENCOUNTER — Ambulatory Visit: Payer: Medicare Other | Admitting: Adult Health

## 2016-07-25 ENCOUNTER — Ambulatory Visit (INDEPENDENT_AMBULATORY_CARE_PROVIDER_SITE_OTHER): Payer: Medicare Other | Admitting: Adult Health

## 2016-07-25 ENCOUNTER — Encounter: Payer: Self-pay | Admitting: Adult Health

## 2016-07-25 ENCOUNTER — Other Ambulatory Visit (INDEPENDENT_AMBULATORY_CARE_PROVIDER_SITE_OTHER): Payer: Medicare Other

## 2016-07-25 VITALS — BP 126/84 | HR 59 | Ht 64.0 in | Wt 204.0 lb

## 2016-07-25 DIAGNOSIS — R079 Chest pain, unspecified: Secondary | ICD-10-CM

## 2016-07-25 DIAGNOSIS — J449 Chronic obstructive pulmonary disease, unspecified: Secondary | ICD-10-CM

## 2016-07-25 LAB — CBC WITH DIFFERENTIAL/PLATELET
BASOS PCT: 0.7 % (ref 0.0–3.0)
Basophils Absolute: 0 10*3/uL (ref 0.0–0.1)
Eosinophils Absolute: 0.1 10*3/uL (ref 0.0–0.7)
Eosinophils Relative: 3 % (ref 0.0–5.0)
HCT: 40.2 % (ref 36.0–46.0)
HEMOGLOBIN: 13.3 g/dL (ref 12.0–15.0)
Lymphocytes Relative: 28.7 % (ref 12.0–46.0)
Lymphs Abs: 1 10*3/uL (ref 0.7–4.0)
MCHC: 32.9 g/dL (ref 30.0–36.0)
MCV: 84.1 fl (ref 78.0–100.0)
MONOS PCT: 6 % (ref 3.0–12.0)
Monocytes Absolute: 0.2 10*3/uL (ref 0.1–1.0)
Neutro Abs: 2.2 10*3/uL (ref 1.4–7.7)
Neutrophils Relative %: 61.6 % (ref 43.0–77.0)
Platelets: 145 10*3/uL — ABNORMAL LOW (ref 150.0–400.0)
RBC: 4.78 Mil/uL (ref 3.87–5.11)
RDW: 15.5 % (ref 11.5–15.5)
WBC: 3.5 10*3/uL — AB (ref 4.0–10.5)

## 2016-07-25 LAB — HEPATIC FUNCTION PANEL
ALK PHOS: 64 U/L (ref 39–117)
ALT: 14 U/L (ref 0–35)
AST: 14 U/L (ref 0–37)
Albumin: 4.5 g/dL (ref 3.5–5.2)
BILIRUBIN DIRECT: 0.1 mg/dL (ref 0.0–0.3)
TOTAL PROTEIN: 7.8 g/dL (ref 6.0–8.3)
Total Bilirubin: 0.4 mg/dL (ref 0.2–1.2)

## 2016-07-25 LAB — BASIC METABOLIC PANEL
BUN: 22 mg/dL (ref 6–23)
CALCIUM: 9.6 mg/dL (ref 8.4–10.5)
CO2: 27 mEq/L (ref 19–32)
CREATININE: 0.96 mg/dL (ref 0.40–1.20)
Chloride: 104 mEq/L (ref 96–112)
GFR: 62.71 mL/min (ref >60.00)
Glucose, Bld: 102 mg/dL — ABNORMAL HIGH (ref 70–99)
Potassium: 3.9 mEq/L (ref 3.5–5.1)
SODIUM: 138 meq/L (ref 135–145)

## 2016-07-25 LAB — SEDIMENTATION RATE: SED RATE: 19 mm/h (ref 0–30)

## 2016-07-25 LAB — AMYLASE: Amylase: 55 U/L (ref 27–131)

## 2016-07-25 LAB — LIPASE: Lipase: 7 U/L — ABNORMAL LOW (ref 11.0–59.0)

## 2016-07-25 NOTE — Patient Instructions (Signed)
Please Restart Prilosec 20 mg daily before meal Restart Pepcid 20 mg at bedtime. Delsym 2 teaspoons twice daily as needed for cough. Labs today Work on not smoking Follow Dr. Melvyn Novas in 6 weeks with PFT and As needed   Please contact office for sooner follow up if symptoms do not improve or worsen or seek emergency care

## 2016-07-25 NOTE — Progress Notes (Signed)
@Patient  ID: Jill Newton, female    DOB: 12-01-1955, 61 y.o.   MRN: [id number]  Chief Complaint  Patient presents with  . Acute Visit    COPD     Referring provider: No ref. provider found  HPI: 61 year old female, active smoker , seen for pulmonary consult 06/02/16 for recurrent bronchitis/chronic cough /atypical chest wall pain   TEST  Spirometry 06/02/2016  FEV1 1.44 (70%)  Ratio 73 with mild curvature    07/25/2016 Acute OV : Bronchitis/Cough /Atypical chest pain Pt presents for an acute office visit for persistent atypical chest wall pain.  Says she has had this mid chest wall pain for last 10 yrs . Had cardiac cath 05/2010 with normal coronary anatomy . EF 65% . Over the years has had 3 neg D Dimers.. Has been treated with percocet and advil over the years. Says the pain pills help but does not want to use pain pills all the time.  Has depression/pstd . Does use marajuana .   Patient was seen last month for pulmonary consult for recurrent bronchitis, atypical chest wall pain and chronic cough. Patient was changed from lisinopril to Diovan. Spiriva was stopped. Patient spirometry was normal with no airflow obstruction.. She has not seen much change with cough .  Took prilosec for 2 weeks but stopped bc does not have GERD sx .   She denies any hemoptysis, fever, orthopnea, PND, palpitations, weight loss or calf pain.     Allergies  Allergen Reactions  . Codeine Nausea Only     There is no immunization history on file for this patient.  Past Medical History:  Diagnosis Date  . Chest pain   . COPD (chronic obstructive pulmonary disease) (HCC)    Wheezing, SOB  . Dyspnea    When going up inclines  . Edema    In ankles. Since going off meds  . Enlarged heart   . GERD (gastroesophageal reflux disease)   . Hyperlipidemia   . Hypertension   . Migraine   . Palpitations     Tobacco History: History  Smoking Status  . Current Every Day Smoker  . Packs/day:  0.50  . Years: 46.00  . Types: Cigarettes  Smokeless Tobacco  . Never Used   Ready to quit: Yes Counseling given: Yes   Outpatient Encounter Prescriptions as of 07/25/2016  Medication Sig  . albuterol (PROVENTIL HFA;VENTOLIN HFA) 108 (90 BASE) MCG/ACT inhaler Inhale 2 puffs into the lungs 3 (three) times daily as needed. For shortness of breath  . ALPRAZolam (XANAX) 0.5 MG tablet Take 1 tablet (0.5 mg total) by mouth at bedtime as needed.  . calcium citrate-vitamin D 200-200 MG-UNIT TABS Take 1 tablet by mouth daily.  . simvastatin (ZOCOR) 10 MG tablet Take 10 mg by mouth at bedtime.  . valsartan-hydrochlorothiazide (DIOVAN HCT) 160-25 MG tablet Take 1 tablet by mouth daily.  . famotidine (PEPCID) 20 MG tablet One at bedtime (Patient not taking: Reported on 07/25/2016)  . pantoprazole (PROTONIX) 40 MG tablet Take 1 tablet (40 mg total) by mouth daily. Take 30-60 min before first meal of the day (Patient not taking: Reported on 07/25/2016)   No facility-administered encounter medications on file as of 07/25/2016.      Review of Systems  Constitutional:   No  weight loss, night sweats,  Fevers, chills,  +fatigue, or  lassitude.  HEENT:   No headaches,  Difficulty swallowing,  Tooth/dental problems, or  Sore throat,  No sneezing, itching, ear ache, nasal congestion, post nasal drip,   CV:  No    Orthopnea, PND, swelling in lower extremities, anasarca, dizziness, palpitations, syncope.   GI  No heartburn, indigestion, abdominal pain, nausea, vomiting, diarrhea, change in bowel habits, loss of appetite, bloody stools.   Resp:  .  No chest wall deformity   Skin: no rash or lesions.  GU: no dysuria, change in color of urine, no urgency or frequency.  No flank pain, no hematuria   MS:  No joint pain or swelling.  No decreased range of motion.  No back pain.    Physical Exam  BP 126/84 (BP Location: Left Arm, Cuff Size: Normal)   Pulse (!) 59   Ht 5\' 4"  (1.626 m)   Wt  204 lb (92.5 kg)   SpO2 99%   BMI 35.02 kg/m   GEN: A/Ox3; pleasant , NAD, obese    HEENT:  [place]/AT,  EACs-clear, TMs-wnl, NOSE-clear, THROAT-clear, no lesions, no postnasal drip or exudate noted.   NECK:  Supple w/ fair ROM; no JVD; normal carotid impulses w/o bruits; no thyromegaly or nodules palpated; no lymphadenopathy.    RESP  Clear  P & A; w/o, wheezes/ rales/ or rhonchi. no accessory muscle use, no dullness to percussion. All mid chest wall between breast is tender area , no palpable mass, deformity . No rash .   CARD:  RRR, no m/r/g, no peripheral edema, pulses intact, no cyanosis or clubbing.  GI:   Soft & nt; nml bowel sounds; no organomegaly or masses detected.   Musco: Warm bil, no deformities or joint swelling noted.   Neuro: alert, no focal deficits noted.    Skin: Warm, no lesions or rashes    Lab Results:  CBC  BNP No results found for: BNP  ProBNP No results found for: PROBNP  Imaging: No results found.   Assessment & Plan:   No problem-specific Assessment & Plan notes found for this encounter.     Rexene Edison, NP 07/25/2016

## 2016-07-25 NOTE — Assessment & Plan Note (Signed)
Smoker with no sign COPD on Spirometry  Check PFT on return  Remain off ACE due to chronic cough  Add Delsym  Smoking cessation

## 2016-07-25 NOTE — Progress Notes (Signed)
Chart and office note reviewed in detail  > agree with a/p as outlined    

## 2016-07-25 NOTE — Assessment & Plan Note (Addendum)
Atypical chest wall pain for >10 yr ? Etiology - possible musculoskeletal .  Prev card cath in 2012  Clear. D Dimer over the years neg. CXR is clear .  Spirometry is normal .  Will check labs -RA/ANA Humberto Leep /ESR /GI labs with Amylase/Lipase/LFT  Cont GERD tx  Plan  Patient Instructions  Please Restart Prilosec 20 mg daily before meal Restart Pepcid 20 mg at bedtime. Delsym 2 teaspoons twice daily as needed for cough. Labs today Work on not smoking Follow Dr. Melvyn Novas in 6 weeks with PFT and As needed   Please contact office for sooner follow up if symptoms do not improve or worsen or seek emergency care

## 2016-07-26 LAB — RESPIRATORY ALLERGY PROFILE REGION II ~~LOC~~
Allergen, A. alternata, m6: <0.1 kU/L
Allergen, Cedar tree, t12: <0.1 kU/L
Allergen, Comm Silver Birch, t9: <0.1 kU/L
Allergen, Cottonwood, t14: <0.1 kU/L
Allergen, D pternoyssinus,d7: <0.1 kU/L
Allergen, Mouse Urine Protein, e78: <0.1 kU/L
Allergen, Mulberry, t76: <0.1 kU/L
Allergen, P. notatum, m1: <0.1 kU/L
Aspergillus fumigatus, m3: <0.1 kU/L
Bermuda Grass: <0.1 kU/L
Box Elder IgE: <0.1 kU/L
Cockroach: <0.1 kU/L
Common Ragweed: <0.1 kU/L
Dog Dander: <0.1 kU/L
Elm IgE: <0.1 kU/L
IgE (Immunoglobulin E), Serum: 8 kU/L (ref <115)
Rough Pigweed  IgE: <0.1 kU/L
Sheep Sorrel IgE: <0.1 kU/L

## 2016-07-26 LAB — RHEUMATOID FACTOR

## 2016-07-26 LAB — ANTI-NUCLEAR AB-TITER (ANA TITER)

## 2016-07-26 LAB — ANA: Anti Nuclear Antibody(ANA): POSITIVE — AB

## 2016-08-07 ENCOUNTER — Telehealth: Payer: Self-pay | Admitting: Adult Health

## 2016-08-07 DIAGNOSIS — R768 Other specified abnormal immunological findings in serum: Secondary | ICD-10-CM

## 2016-08-07 NOTE — Telephone Encounter (Signed)
Spoke with pt. She was given her lab results on 07/27/16 and was told that she was to have a referral to Rheumatology. This referral was not placed by the clinical staff member who called her. Referral has now been placed. I apologized to the pt for any inconvenience. Nothing further was needed.

## 2016-09-11 ENCOUNTER — Other Ambulatory Visit: Payer: Self-pay | Admitting: Internal Medicine

## 2016-09-11 DIAGNOSIS — R0600 Dyspnea, unspecified: Secondary | ICD-10-CM

## 2016-09-12 ENCOUNTER — Encounter (HOSPITAL_COMMUNITY): Payer: Self-pay | Admitting: Emergency Medicine

## 2016-09-12 ENCOUNTER — Emergency Department (HOSPITAL_COMMUNITY)
Admission: EM | Admit: 2016-09-12 | Discharge: 2016-09-12 | Disposition: A | Discharge status: Home / Self Care | Payer: Medicare Other | Attending: Emergency Medicine | Admitting: Emergency Medicine

## 2016-09-12 ENCOUNTER — Ambulatory Visit: Payer: Medicare Other | Admitting: Internal Medicine

## 2016-09-12 DIAGNOSIS — I10 Essential (primary) hypertension: Secondary | ICD-10-CM | POA: Insufficient documentation

## 2016-09-12 DIAGNOSIS — G8929 Other chronic pain: Secondary | ICD-10-CM

## 2016-09-12 DIAGNOSIS — R0789 Other chest pain: Secondary | ICD-10-CM | POA: Diagnosis not present

## 2016-09-12 DIAGNOSIS — Z79899 Other long term (current) drug therapy: Secondary | ICD-10-CM | POA: Diagnosis not present

## 2016-09-12 DIAGNOSIS — R079 Chest pain, unspecified: Secondary | ICD-10-CM

## 2016-09-12 DIAGNOSIS — F17210 Nicotine dependence, cigarettes, uncomplicated: Secondary | ICD-10-CM | POA: Diagnosis not present

## 2016-09-12 DIAGNOSIS — N644 Mastodynia: Secondary | ICD-10-CM | POA: Diagnosis present

## 2016-09-12 DIAGNOSIS — J449 Chronic obstructive pulmonary disease, unspecified: Secondary | ICD-10-CM | POA: Diagnosis not present

## 2016-09-12 MED ORDER — OXYCODONE-ACETAMINOPHEN 5-325 MG PO TABS
2.0000 | ORAL_TABLET | Freq: Once | ORAL | Status: AC
  Administered 2016-09-12: 2 via ORAL
  Filled 2016-09-12: qty 2

## 2016-09-12 MED ORDER — TRAMADOL HCL 50 MG PO TABS
50.0000 mg | ORAL_TABLET | Freq: Once | ORAL | Status: DC
Start: 2016-09-12 — End: 2016-09-12

## 2016-09-12 MED ORDER — OXYCODONE-ACETAMINOPHEN 5-325 MG PO TABS: 1.0000 | ORAL_TABLET | Freq: Four times a day (QID) | ORAL | 0 refills | Status: DC | PRN

## 2016-09-12 NOTE — ED Notes (Signed)
Bed: WA04 Expected date:  Expected time:  Means of arrival:  Comments: 

## 2016-09-12 NOTE — ED Notes (Signed)
ED Provider at bedside. 

## 2016-09-12 NOTE — ED Provider Notes (Signed)
[facility] DEPT Provider Note   CSN: [id number] Arrival date & time: 09/12/16  1700     History   Chief Complaint Chief Complaint  Patient presents with  . Breast Pain    HPI Jill Newton is a 61 y.o. female.  Patient c/o chronic chest pain for the past several years. No acute or abrupt change today. Indicates is out of her pain medication, percocet, and that her doctors office was closed today.  No acute or abrupt change in her pain. No recent injury or strain. Pain occurs at rest, and is continuous, mod-severe. Worse w certain positions and palpation. No associated sob, nv or diaphoresis. No unusual doe or fatigue. No cough or uri c/o. No fever or chills. Pain is not pleuritic. No leg pain or swelling.  Pt states ran out of her medication 5-6 days ago.    The history is provided by the patient.    Past Medical History:  Diagnosis Date  . Chest pain   . COPD (chronic obstructive pulmonary disease) (HCC)    Wheezing, SOB  . Dyspnea    When going up inclines  . Edema    In ankles. Since going off meds  . Enlarged heart   . GERD (gastroesophageal reflux disease)   . Hyperlipidemia   . Hypertension   . Migraine   . Palpitations     Patient Active Problem List   Diagnosis Date Noted  . Cigarette smoker 06/03/2016  . COPD  GOLD 0 active smoker   . Essential hypertension   . Enlarged heart   . GERD (gastroesophageal reflux disease)   . Chest pain     Past Surgical History:  Procedure Laterality Date  . TUBAL LIGATION      OB History    No data available       Home Medications    Prior to Admission medications   Medication Sig Start Date End Date Taking? Authorizing Provider  albuterol (PROVENTIL HFA;VENTOLIN HFA) 108 (90 BASE) MCG/ACT inhaler Inhale 2 puffs into the lungs 3 (three) times daily as needed. For shortness of breath    [provider]  ALPRAZolam (XANAX) 0.5 MG tablet Take 1 tablet (0.5 mg total) by mouth at bedtime as  needed. 06/05/16   Tanda Rockers, MD  calcium citrate-vitamin D 200-200 MG-UNIT TABS Take 1 tablet by mouth daily.    [provider]  famotidine (PEPCID) 20 MG tablet One at bedtime Patient not taking: Reported on 07/25/2016 06/02/16   Tanda Rockers, MD  pantoprazole (PROTONIX) 40 MG tablet Take 1 tablet (40 mg total) by mouth daily. Take 30-60 min before first meal of the day Patient not taking: Reported on 07/25/2016 06/02/16   Tanda Rockers, MD  simvastatin (ZOCOR) 10 MG tablet Take 10 mg by mouth at bedtime.    [provider]  valsartan-hydrochlorothiazide (DIOVAN HCT) 160-25 MG tablet Take 1 tablet by mouth daily. 06/02/16   Tanda Rockers, MD    Family History Family History  Problem Relation Age of Onset  . CAD Unknown   . Diabetes Unknown     Social History Social History  Substance Use Topics  . Smoking status: Current Every Day Smoker    Packs/day: 0.50    Years: 46.00    Types: Cigarettes  . Smokeless tobacco: Never Used  . Alcohol use No     Allergies   Codeine   Review of Systems Review of Systems  Constitutional: Negative for fever.  HENT: Negative for sore throat.   Eyes: Negative for redness.  Respiratory: Negative for cough and shortness of breath.   Cardiovascular: Negative for palpitations and leg swelling.  Gastrointestinal: Negative for abdominal pain and vomiting.  Genitourinary: Negative for flank pain.  Musculoskeletal: Negative for neck pain.  Skin: Negative for rash.  Neurological: Negative for headaches.  Hematological: Does not bruise/bleed easily.  Psychiatric/Behavioral: Negative for confusion.     Physical Exam Updated Vital Signs BP (!) 135/97 (BP Location: Left Arm)   Pulse 66   Temp 97.8 F (36.6 C) (Oral)   Resp 17   SpO2 100%   Physical Exam  Constitutional: She appears well-developed and well-nourished. No distress.  HENT:  Mouth/Throat: Oropharynx is clear and moist.  Eyes: Conjunctivae are normal.  No scleral icterus.  Neck: Neck supple. No tracheal deviation present.  Cardiovascular: Normal rate, regular rhythm, normal heart sounds and intact distal pulses.  Exam reveals no gallop and no friction rub.   No murmur heard. Pulmonary/Chest: Effort normal. No respiratory distress. She exhibits tenderness.  No breast mass felt. Pain is located right sternal/xiphoid area.   Abdominal: Soft. Normal appearance and bowel sounds are normal. She exhibits no distension. There is no tenderness.  Musculoskeletal: She exhibits no edema.  TLS spine non tender, aligned.   Neurological: She is alert.  Skin: Skin is warm and dry. No rash noted. She is not diaphoretic.  Psychiatric: She has a normal mood and affect.  Nursing note and vitals reviewed.    ED Treatments / Results  Labs (all labs ordered are listed, but only abnormal results are displayed) Labs Reviewed - No data to display  EKG  EKG Interpretation None       Radiology No results found.  Procedures Procedures (including critical care time)  Medications Ordered in ED Medications  oxyCODONE-acetaminophen (PERCOCET/ROXICET) 5-325 MG per tablet 2 tablet (not administered)     Initial Impression / Assessment and Plan / ED Course  I have reviewed the triage vital signs and the nursing notes.  Pertinent labs & imaging results that were available during my care of the patient were reviewed by me and considered in my medical decision making (see chart for details).  Patient presents indicating this is her same persistent chronic pain without change. She indicates had appt w pcp, Dr Alyson Ingles, but that his office was closed today.   Reviewed nursing notes and prior charts for additional history.  Prior extensive workup including cxrs, cardiac cath, unremarkable.   Pts pain appears musculoskeletal.    Pt indicates no meds pta today, and that she has ride, did not drive here.  Percocet po.  Will give very small quantity rx for  today, but pt must get any additional refill of chronic pain meds for her pcp.      Final Clinical Impressions(s) / ED Diagnoses   Final diagnoses:  None    New Prescriptions New Prescriptions   No medications on file     Lajean Saver, MD 09/12/16 1144

## 2016-09-12 NOTE — Discharge Instructions (Signed)
It was our pleasure to provide your ER care today - we hope that you feel better.  For your pain, try taking motrin or aleve as need. You may also take percocet as need for pain. No driving for the next 6 hours or when taking percocet. Also, do not take tylenol or acetaminophen containing medication when taking percocet.   Should you require additional prescription for narcotic-type of pain medication, you must get  that from  your primary care doctor.   Return to ER if worse, new symptoms, fevers, trouble breathing, other concern.

## 2016-09-12 NOTE — ED Triage Notes (Signed)
Pt complaint of acute chronic right breast pain for a few months; out of pain medication for same for a week. Pt verbalizes diagnosed with "a knot" on right breast.

## 2016-09-25 ENCOUNTER — Telehealth: Payer: Self-pay | Admitting: Internal Medicine

## 2016-09-25 MED ORDER — LOSARTAN POTASSIUM-HCTZ 100-25 MG PO TABS: 1.0000 | ORAL_TABLET | Freq: Every day | ORAL | 1 refills | Status: DC

## 2016-09-25 NOTE — Telephone Encounter (Signed)
Called and spoke with pt and she stated that she received a letter that the valsartan/hctz she is on has been recalled and causes cancer.  She stated that she needs to see if MW will change this medication for her.  She stated that she likes this med cause it doesn't cause her to have headaches like the lisinopril did.  MW please advise. Thanks

## 2016-09-25 NOTE — Telephone Encounter (Signed)
Called and spoke with pt and she is aware of MW recs.  Med has been sent to the pharmacy and nothing further is needed.

## 2016-09-25 NOTE — Telephone Encounter (Signed)
Try losartan 100-25 one daily but will need to get PCP or one of our NPs to check in a few weeks to see if tolerating/working well

## 2017-03-27 ENCOUNTER — Encounter: Payer: Self-pay | Admitting: Gastroenterology

## 2017-05-02 NOTE — Progress Notes (Signed)
Pt paperwork entered

## 2017-05-04 ENCOUNTER — Encounter: Payer: Self-pay | Admitting: Gastroenterology

## 2017-05-04 ENCOUNTER — Ambulatory Visit (INDEPENDENT_AMBULATORY_CARE_PROVIDER_SITE_OTHER): Payer: Medicare Other | Admitting: Gastroenterology

## 2017-05-04 VITALS — BP 124/90 | HR 64 | Ht 63.5 in | Wt 186.4 lb

## 2017-05-04 DIAGNOSIS — K5900 Constipation, unspecified: Secondary | ICD-10-CM | POA: Diagnosis not present

## 2017-05-04 DIAGNOSIS — R0789 Other chest pain: Secondary | ICD-10-CM

## 2017-05-04 DIAGNOSIS — R1310 Dysphagia, unspecified: Secondary | ICD-10-CM | POA: Diagnosis not present

## 2017-05-04 DIAGNOSIS — Z1211 Encounter for screening for malignant neoplasm of colon: Secondary | ICD-10-CM

## 2017-05-04 MED ORDER — SUPREP BOWEL PREP KIT 17.5-3.13-1.6 GM/177ML PO SOLN: ORAL | 0 refills | Status: DC

## 2017-05-04 MED ORDER — LINACLOTIDE 72 MCG PO CAPS: 72.0000 ug | ORAL_CAPSULE | Freq: Every day | ORAL | 0 refills | Status: DC

## 2017-05-04 NOTE — Patient Instructions (Addendum)
If you are age 63 or older, your body mass index should be between 23-30. Your Body mass index is 32.5 kg/m. If this is out of the aforementioned range listed, please consider follow up with your Primary Care Provider.  If you are age 59 or younger, your body mass index should be between 19-25. Your Body mass index is 32.5 kg/m. If this is out of the aformentioned range listed, please consider follow up with your Primary Care Provider.   You have been scheduled for an endoscopy and colonoscopy. Please follow the written instructions given to you at your visit today. Please pick up your prep supplies at the pharmacy within the next 1-3 days. If you use inhalers (even only as needed), please bring them with you on the day of your procedure. Your physician has requested that you go to www.startemmi.com and enter the access code given to you at your visit today. This web site gives a general overview about your procedure. However, you should still follow specific instructions given to you by our office regarding your preparation for the procedure   You may have a light breakfast the morning of prep day (the day before the procedure).   You may choose from one of the following items: eggs and toast OR chicken noodle soup and crackers.   You should have your breakfast completed between 8:00 and 9:00 am the day before your procedure.    After you have had your light breakfast you should start a clear liquid diet only, NO SOLIDS. No additional solid food is allowed. You may continue to have clear liquid up to 3 hours prior to your procedure.   +++++++++++++++++++++++++++++++++++  You can take 2 peppermint Altoids as needed for esophageal spasms.  We have given you samples of the following medication to take: Linzess 88mcg   Thank you for entrusting me with your care and for choosing [employer], Dr. [place] Cellar

## 2017-05-04 NOTE — Progress Notes (Signed)
HPI :  62 y/o female with a history of COPD, HTN, chronic chest pain referred here by Dr. Ricke Hey for a new patient visit for dysphagia / chest pain.  The patient reports she has had long-standing chest discomfort for more than 10 years at least.  She states she feels a knot in her lower chest that can radiate through into her back.  She feels this every day, multiple times per day, lasts various amount of time.  She states she has to lie down and put a heating pad on it to help make it get better.  She has had an extensive evaluation for this including cardiac catheterization in 2012 which was been negative.  She states when she has chest pain she has a hard time eating, states that food does not go down to only get stuck.  She says this usually occurs with eating breads and pastas.  She states if she is not discomfort she does not have much difficulty swallowing.  She denies any postprandial abdominal pains, or nausea otherwise.  She has been taking 40 mg of omeprazole twice daily for the past 2 weeks.  She states this is not helped at all.  She states she has never had heartburn or regurgitation that bothers her.  She has tried numerous over-the-counter antacids which have not helped.  She has never had an upper endoscopy. She has been taking Percocet chronically for her chest pain.  She denies any changes in her bowel habits that are new.  She endorses chronic constipation, states she has tried MiraLAX in the past which did not help.  She denies any blood in her stools.  Her last colonoscopy was in 2004 normal.  Cardiac cath 2012 normal, EF 65%   Past Medical History:  Diagnosis Date  . Anxiety   . Chest pain   . COPD (chronic obstructive pulmonary disease) (HCC)    Wheezing, SOB  . Depression   . Dyspnea    When going up inclines  . Edema    In ankles. Since going off meds  . Enlarged heart   . GERD (gastroesophageal reflux disease)   . Hyperlipidemia   . Hypertension   .  Migraine   . Palpitations   . Peptic ulcer      Past Surgical History:  Procedure Laterality Date  . CARDIAC CATHETERIZATION    . TUBAL LIGATION     Family History  Problem Relation Age of Onset  . Congestive Heart Failure Mother   . Diabetes Mother   . Hypertension Mother   . Stroke Father   . Cancer Brother        type unknown  . Heart disease Sister   . Bone cancer Maternal Aunt   . Cancer Maternal Uncle        type unknown  . Diabetes Sister    Social History   Tobacco Use  . Smoking status: Current Every Day Smoker    Packs/day: 0.50    Years: 46.00    Pack years: 23.00    Types: Cigarettes  . Smokeless tobacco: Never Used  Substance Use Topics  . Alcohol use: No  . Drug use: Yes    Types: Marijuana   Current Outpatient Medications  Medication Sig Dispense Refill  . albuterol (PROVENTIL HFA;VENTOLIN HFA) 108 (90 BASE) MCG/ACT inhaler Inhale 2 puffs into the lungs 3 (three) times daily as needed. For shortness of breath    . ALPRAZolam (XANAX) 0.5 MG tablet Take  1 tablet (0.5 mg total) by mouth at bedtime as needed. 30 tablet 0  . calcium citrate-vitamin D 200-200 MG-UNIT TABS Take 1 tablet by mouth daily.    Marland Kitchen oxyCODONE-acetaminophen (PERCOCET/ROXICET) 5-325 MG tablet Take 1-2 tablets by mouth every 6 (six) hours as needed for severe pain. 10 tablet 0  . linaclotide (LINZESS) 72 MCG capsule Take 1 capsule (72 mcg total) by mouth daily before breakfast. 12 capsule 0  . SUPREP BOWEL PREP KIT 17.5-3.13-1.6 GM/177ML SOLN Suprep-Use as directed 354 mL 0   No current facility-administered medications for this visit.   Low Allergies  Allergen Reactions  . Codeine Nausea Only     Review of Systems: All systems reviewed and negative except where noted in HPI.   Lab Results  Component Value Date   WBC 3.5 (L) 07/25/2016   HGB 13.3 07/25/2016   HCT 40.2 07/25/2016   MCV 84.1 07/25/2016   PLT 145.0 (L) 07/25/2016    Lab Results  Component Value Date    CREATININE 0.96 07/25/2016   BUN 22 07/25/2016   NA 138 07/25/2016   K 3.9 07/25/2016   CL 104 07/25/2016   CO2 27 07/25/2016    Lab Results  Component Value Date   ALT 14 07/25/2016   AST 14 07/25/2016   ALKPHOS 64 07/25/2016   BILITOT 0.4 07/25/2016     Physical Exam: BP 124/90 (BP Location: Left Arm, Patient Position: Sitting, Cuff Size: Normal)   Pulse 64   Ht 5' 3.5" (1.613 m) Comment: height measured without shoes  Wt 186 lb 6 oz (84.5 kg)   BMI 32.50 kg/m  Constitutional: Pleasant,well-developed, female in no acute distress. HEENT: Normocephalic and atraumatic. Conjunctivae are normal. No scleral icterus. Neck supple.  Cardiovascular: Normal rate, regular rhythm.  Pulmonary/chest: Effort normal and breath sounds normal. No wheezing, rales or rhonchi. Abdominal: Soft, nondistended, nontender.  There are no masses palpable. No hepatomegaly. Extremities: no edema Lymphadenopathy: No cervical adenopathy noted. Neurological: Alert and oriented to person place and time. Skin: Skin is warm and dry. No rashes noted. Psychiatric: Normal mood and affect. Behavior is normal.   ASSESSMENT AND PLAN: 62 year old female here for new patient visit to discuss the following issues:  Atypical chest pain / dysphagia - history as outlined above, chronic symptoms of intermittent chest discomfort which is severe.  She endorses some dysphagia at the time of the symptoms, and then they go away without intervention.  She has never had a prior upper endoscopy.  Given the symptoms and recommending an upper endoscopy to further evaluate the symptoms and potentially treat with dilation if she has a stricture.  I discussed the risks and benefits of EGD and anesthesia with her and she want to proceed.  If possible she has esophageal spasm causing her symptoms.  In the interim we will try high-dose peppermint supplementation to see if this will help spasm if she has symptoms.  Recommended trying  peppermint altoids, 2 at a time for pain.  If EGD is negative we may need to consider manometry.  She agreed with the plan.  Chronic constipation - discussed options, will try low-dose Linzess to start at 72 mcg/day.  Samples given she can call and let us know if she wishes to continue with this or not.  Colon cancer screening - overdue for screening exam.  Recommend optical colonoscopy.  Following discussion of risks and benefits she wants to proceed at same time as EGD.  [place] Cellar, MD [facility] Pager  [phone]  CC: Ricke Hey, MD

## 2017-05-21 ENCOUNTER — Ambulatory Visit (AMBULATORY_SURGERY_CENTER): Payer: Medicare Other | Admitting: Gastroenterology

## 2017-05-21 ENCOUNTER — Encounter: Payer: Self-pay | Admitting: Gastroenterology

## 2017-05-21 ENCOUNTER — Other Ambulatory Visit: Payer: Self-pay

## 2017-05-21 VITALS — BP 140/92 | HR 48 | Temp 96.2°F | Resp 13 | Ht 63.5 in | Wt 186.0 lb

## 2017-05-21 DIAGNOSIS — R1310 Dysphagia, unspecified: Secondary | ICD-10-CM | POA: Diagnosis not present

## 2017-05-21 DIAGNOSIS — K319 Disease of stomach and duodenum, unspecified: Secondary | ICD-10-CM | POA: Diagnosis not present

## 2017-05-21 DIAGNOSIS — R0789 Other chest pain: Secondary | ICD-10-CM | POA: Diagnosis not present

## 2017-05-21 DIAGNOSIS — K228 Other specified diseases of esophagus: Secondary | ICD-10-CM | POA: Diagnosis not present

## 2017-05-21 DIAGNOSIS — D122 Benign neoplasm of ascending colon: Secondary | ICD-10-CM | POA: Diagnosis not present

## 2017-05-21 DIAGNOSIS — K3189 Other diseases of stomach and duodenum: Secondary | ICD-10-CM | POA: Diagnosis not present

## 2017-05-21 DIAGNOSIS — Z1211 Encounter for screening for malignant neoplasm of colon: Secondary | ICD-10-CM | POA: Diagnosis not present

## 2017-05-21 MED ORDER — SODIUM CHLORIDE 0.9 % IV SOLN
500.0000 mL | Freq: Once | INTRAVENOUS | Status: DC
Start: 2017-05-21 — End: 2017-10-19

## 2017-05-21 NOTE — Progress Notes (Signed)
I have reviewed the patient's medical history in detail and updated the computerized patient record.

## 2017-05-21 NOTE — Progress Notes (Signed)
Patient does not follow directions well.  She will not stay stiill for IV removal. Kept interrupting me during discharge instructions. The husband and sons seem to understand instructions.

## 2017-05-21 NOTE — Progress Notes (Signed)
Called to room to assist during endoscopic procedure.  Patient ID and intended procedure confirmed with present staff. Received instructions for my participation in the procedure from the performing physician.  

## 2017-05-21 NOTE — Patient Instructions (Signed)
YOU HAD AN ENDOSCOPIC PROCEDURE TODAY AT [facility]:   Refer to the procedure report that was given to you for any specific questions about what was found during the examination.  If the procedure report does not answer your questions, please call your gastroenterologist to clarify.  If you requested that your care partner not be given the details of your procedure findings, then the procedure report has been included in a sealed envelope for you to review at your convenience later.  YOU SHOULD EXPECT: Some feelings of bloating in the abdomen. Passage of more gas than usual.  Walking can help get rid of the air that was put into your GI tract during the procedure and reduce the bloating. If you had a lower endoscopy (such as a colonoscopy or flexible sigmoidoscopy) you may notice spotting of blood in your stool or on the toilet paper. If you underwent a bowel prep for your procedure, you may not have a normal bowel movement for a few days.  Please Note:  You might notice some irritation and congestion in your nose or some drainage.  This is from the oxygen used during your procedure.  There is no need for concern and it should clear up in a day or so.  SYMPTOMS TO REPORT IMMEDIATELY:   Following lower endoscopy (colonoscopy or flexible sigmoidoscopy):  Excessive amounts of blood in the stool  Significant tenderness or worsening of abdominal pains  Swelling of the abdomen that is new, acute  Fever of 100F or higher   Following upper endoscopy (EGD)  Vomiting of blood or coffee ground material  New chest pain or pain under the shoulder blades  Painful or persistently difficult swallowing  New shortness of breath  Fever of 100F or higher  Black, tarry-looking stools  For urgent or emergent issues, a gastroenterologist can be reached at any hour by calling [phone].   DIET:  We do recommend a small meal at first, but then you may proceed to your regular diet.  Drink  plenty of fluids but you should avoid alcoholic beverages for 24 hours.  No NSAIDS for 2 weeks:aspirin, ibuprofen and aleve to prevent bleeding.  ACTIVITY:  You should plan to take it easy for the rest of today and you should NOT DRIVE or use heavy machinery until tomorrow (because of the sedation medicines used during the test).    FOLLOW UP: Our staff will call the number listed on your records the next business day following your procedure to check on you and address any questions or concerns that you may have regarding the information given to you following your procedure. If we do not reach you, we will leave a message.  However, if you are feeling well and you are not experiencing any problems, there is no need to return our call.  We will assume that you have returned to your regular daily activities without incident.  If any biopsies were taken you will be contacted by phone or by letter within the next 1-3 weeks.  Please call us at [phone] if you have not heard about the biopsies in 3 weeks.    SIGNATURES/CONFIDENTIALITY: You and/or your care partner have signed paperwork which will be entered into your electronic medical record.  These signatures attest to the fact that that the information above on your After Visit Summary has been reviewed and is understood.  Full responsibility of the confidentiality of this discharge information lies with you and/or your care-partner.  We will send you a letter when you can schedule your repeat colonoscopy due to an inadequate prep.

## 2017-05-21 NOTE — Op Note (Signed)
[facility] Patient Name: Jill Newton Procedure Date: 05/21/2017 4:36 PM MRN: [id number] Endoscopist: Remo Lipps P. Armbruster MD, MD Age: 62 Referring MD:  Date of Birth: [birth year] Gender: Female Account #: [id number] Procedure:                Upper GI endoscopy Indications:              Dysphagia, atypical chest pain (non cardiac),                            negative cardiac workup, no improvement with PPI Medicines:                Monitored Anesthesia Care Procedure:                Pre-Anesthesia Assessment:                           - Prior to the procedure, a History and Physical                            was performed, and patient medications and                            allergies were reviewed. The patient's tolerance of                            previous anesthesia was also reviewed. The risks                            and benefits of the procedure and the sedation                            options and risks were discussed with the patient.                            All questions were answered, and informed consent                            was obtained. Prior Anticoagulants: The patient has                            taken no previous anticoagulant or antiplatelet                            agents. ASA Grade Assessment: II - A patient with                            mild systemic disease. After reviewing the risks                            and benefits, the patient was deemed in                            satisfactory condition to undergo the procedure.  After obtaining informed consent, the endoscope was                            passed under direct vision. Throughout the                            procedure, the patient's blood pressure, pulse, and                            oxygen saturations were monitored continuously. The                            Endoscope was introduced through the mouth, and   advanced to the second part of duodenum. The upper                            GI endoscopy was accomplished without difficulty.                            The patient tolerated the procedure well. Scope In: Scope Out: Findings:                 Esophagogastric landmarks were identified: the                            Z-line was found at 41 cm, the gastroesophageal                            junction was found at 41 cm and the upper extent of                            the gastric folds was found at 41 cm from the                            incisors.                           The exam of the esophagus was otherwise normal.                           Biopsies were taken with a cold forceps in the                            upper third of the esophagus, in the middle third                            of the esophagus and in the lower third of the                            esophagus for histology to rule out eosinphilic                            esophagitis.  Diffuse mildly erythematous mucosa was found in the                            gastric body without focal ulceration. Biopsies                            were taken with a cold forceps for Helicobacter                            pylori testing.                           The exam of the stomach was otherwise normal.                           Patchy mildly erythematous mucosa was found in the                            duodenal bulb.                           The exam of the duodenum was otherwise normal. Complications:            No immediate complications. Estimated blood loss:                            Minimal. Estimated Blood Loss:     Estimated blood loss was minimal. Impression:               - Esophagogastric landmarks identified.                           - Normal esophagus. Biopsies taken to rule out                            eosinophilic esophagitis                           - Erythematous mucosa in  the gastric body. Biopsied.                           - Erythematous duodenopathy.                           No clear cause for chest symptoms on EGD. Will                            await pathology results. Consider esophageal                            manometry to rule out motility disorder / spasm if                            biopsies negative. Recommendation:           - Patient has a contact number available for  emergencies. The signs and symptoms of potential                            delayed complications were discussed with the                            patient. Return to normal activities tomorrow.                            Written discharge instructions were provided to the                            patient.                           - Resume previous diet.                           - Continue present medications.                           - Await pathology results. Remo Lipps P. Armbruster MD, MD 05/21/2017 5:39:32 PM This report has been signed electronically.

## 2017-05-21 NOTE — Op Note (Signed)
[facility] Patient Name: Jill Newton Procedure Date: 05/21/2017 4:35 PM MRN: [id number] Endoscopist: Remo Lipps P. Armbruster MD, MD Age: 62 Referring MD:  Date of Birth: 07/01/1955 Gender: Female Account #: [id number] Procedure:                Colonoscopy Indications:              Screening for colorectal malignant neoplasm Medicines:                Monitored Anesthesia Care Procedure:                Pre-Anesthesia Assessment:                           - Prior to the procedure, a History and Physical                            was performed, and patient medications and                            allergies were reviewed. The patient's tolerance of                            previous anesthesia was also reviewed. The risks                            and benefits of the procedure and the sedation                            options and risks were discussed with the patient.                            All questions were answered, and informed consent                            was obtained. Prior Anticoagulants: The patient has                            taken no previous anticoagulant or antiplatelet                            agents. ASA Grade Assessment: II - A patient with                            mild systemic disease. After reviewing the risks                            and benefits, the patient was deemed in                            satisfactory condition to undergo the procedure.                           After obtaining informed consent, the colonoscope  was passed under direct vision. Throughout the                            procedure, the patient's blood pressure, pulse, and                            oxygen saturations were monitored continuously. The                            Model PCF-H190DL [id number]) scope was introduced                            through the anus and advanced to the the cecum,   identified by appendiceal orifice and ileocecal                            valve. The colonoscopy was performed without                            difficulty. The patient tolerated the procedure                            well. The quality of the bowel preparation was                            fair. The ileocecal valve, appendiceal orifice, and                            rectum were photographed. Scope In: 5:00:02 PM Scope Out: 5:24:04 PM Scope Withdrawal Time: 0 hours 18 minutes 45 seconds  Total Procedure Duration: 0 hours 24 minutes 2 seconds  Findings:                 The perianal and digital rectal examinations were                            normal.                           A 12 mm polyp was found in the ascending colon. The                            polyp was flat and extended over the back side of                            the fold, making positioning difficult. The polyp                            was removed with a cold snare in pieacemeal                            fashion. Resection and retrieval were complete.                           Scattered small-mouthed  diverticula were found in                            the transverse colon and left colon.                           Internal hemorrhoids were found during retroflexion.                           A moderate amount of semi-liquid stool was found in                            the entire colon, making visualization difficult.                            Lavage of the colon was performed using copious                            amounts of sterile water, resulting in incomplete                            clearance with fair visualization.                           The exam was otherwise without abnormality. Complications:            No immediate complications. Estimated blood loss:                            Minimal. Estimated Blood Loss:     Estimated blood loss was minimal. Impression:               - Preparation of the  colon was fair.                           - One 12 mm polyp in the ascending colon, removed                            with a cold snare. Resected and retrieved.                           - Diverticulosis in the transverse colon and in the                            left colon.                           - Internal hemorrhoids.                           - The examination was otherwise normal.                           Overall, no other large polyps or mass lesions  noted, but small or other flat polyps may not have                            been appreciated on this exam due to limitations of                            prep. Recommendation:           - Patient has a contact number available for                            emergencies. The signs and symptoms of potential                            delayed complications were discussed with the                            patient. Return to normal activities tomorrow.                            Written discharge instructions were provided to the                            patient.                           - Resume previous diet.                           - Continue present medications.                           - Await pathology results.                           - Repeat colonoscopy within 6-12 months because the                            bowel preparation was suboptimal.                           - No ibuprofen, naproxen, or other non-steroidal                            anti-inflammatory drugs for 2 weeks after polyp                            removal. Remo Lipps P. Armbruster MD, MD 05/21/2017 5:32:25 PM This report has been signed electronically.

## 2017-05-21 NOTE — Progress Notes (Signed)
To PACU, VSS. Report to [email or web address] 

## 2017-05-22 ENCOUNTER — Telehealth: Payer: Self-pay

## 2017-05-22 NOTE — Telephone Encounter (Signed)
  Follow up Call-  Call back number 05/21/2017  Post procedure Call Back phone  # [phone]  Permission to leave phone message Yes  Some recent data might be hidden     Patient questions:  Do you have a fever, pain , or abdominal swelling? No. Pain Score  0 *  Have you tolerated food without any problems? Yes.    Have you been able to return to your normal activities? Yes.    Do you have any questions about your discharge instructions: Diet   No. Medications  No. Follow up visit  No.  Do you have questions or concerns about your Care? No.  Actions: * If pain score is 4 or above: No action needed, pain <4.  Pt had no problems noted.  She thanked [place] for the wonderful care she received.  maw

## 2017-05-22 NOTE — Telephone Encounter (Signed)
  Follow up Call-  Call back number 05/21/2017  Post procedure Call Back phone  # [phone]  Permission to leave phone message Yes  Some recent data might be hidden     Left message

## 2017-05-30 ENCOUNTER — Telehealth: Payer: Self-pay | Admitting: Internal Medicine

## 2017-05-30 ENCOUNTER — Other Ambulatory Visit: Payer: Self-pay

## 2017-05-30 DIAGNOSIS — K2970 Gastritis, unspecified, without bleeding: Secondary | ICD-10-CM

## 2017-05-30 DIAGNOSIS — K2990 Gastroduodenitis, unspecified, without bleeding: Principal | ICD-10-CM

## 2017-05-30 NOTE — Telephone Encounter (Signed)
lmtcb 4/10  PKW

## 2017-05-30 NOTE — Telephone Encounter (Signed)
Pt returned your call and would like a call back .. °

## 2017-05-31 NOTE — Telephone Encounter (Signed)
Patient was given date, time and location of esophageal manometry and written instructions were mailed to the patient.

## 2017-06-15 NOTE — Progress Notes (Signed)
Letter previously sent to patient.

## 2017-06-19 NOTE — Progress Notes (Signed)
Called patient to remind them of appointment for tomorrow.  Patient stated "I'll be there at 8:15."

## 2017-06-20 ENCOUNTER — Encounter: Payer: Medicare Other | Admitting: Gastroenterology

## 2017-06-20 ENCOUNTER — Encounter (HOSPITAL_COMMUNITY)
Admission: RE | Disposition: A | Discharge status: Home / Self Care | Payer: Self-pay | Source: Ambulatory Visit | Attending: Gastroenterology

## 2017-06-20 ENCOUNTER — Ambulatory Visit (HOSPITAL_COMMUNITY)
Admission: RE | Admit: 2017-06-20 | Discharge: 2017-06-20 | Disposition: A | Discharge status: Home / Self Care | Payer: Medicare Other | Source: Ambulatory Visit | Attending: Gastroenterology | Admitting: Gastroenterology

## 2017-06-20 DIAGNOSIS — R12 Heartburn: Secondary | ICD-10-CM | POA: Insufficient documentation

## 2017-06-20 DIAGNOSIS — R0789 Other chest pain: Secondary | ICD-10-CM | POA: Diagnosis not present

## 2017-06-20 DIAGNOSIS — R1310 Dysphagia, unspecified: Secondary | ICD-10-CM

## 2017-06-20 DIAGNOSIS — K222 Esophageal obstruction: Secondary | ICD-10-CM | POA: Insufficient documentation

## 2017-06-20 HISTORY — PX: ESOPHAGEAL MANOMETRY: SHX5429

## 2017-06-20 HISTORY — PX: 24 HOUR PH STUDY: SHX5419

## 2017-06-20 SURGERY — MANOMETRY, ESOPHAGUS
Anesthesia: Choice

## 2017-06-20 MED ORDER — LIDOCAINE VISCOUS 2 % MT SOLN
OROMUCOSAL | Status: AC
  Filled 2017-06-20: qty 15

## 2017-06-20 SURGICAL SUPPLY — 2 items
FACESHIELD LNG OPTICON STERILE (SAFETY) IMPLANT
GLOVE BIO SURGEON STRL SZ8 (GLOVE) ×6 IMPLANT

## 2017-06-20 NOTE — Progress Notes (Signed)
Esophageal manometry done per protocol without distress or complication. Pt tolerated well. PH probe placed at 37cm per manometry results. Pt tolerated well. Pt instructed using teach back regarding study and use of monitor. Pt will return to [facility] endoscopy to have probe removed and monitor downloaded tomorrow 930 or after.  The study to be sent to Dr. Ozella Rocks office when Ph is back.

## 2017-06-22 ENCOUNTER — Encounter (HOSPITAL_COMMUNITY): Payer: Self-pay | Admitting: Gastroenterology

## 2017-07-05 ENCOUNTER — Telehealth: Payer: Self-pay | Admitting: Gastroenterology

## 2017-07-05 NOTE — Telephone Encounter (Signed)
Have you had a chance to review these results yet? Thank you.

## 2017-07-05 NOTE — Telephone Encounter (Signed)
Yes, called patient to review results and no answer. Left message. I will call back tomorrow

## 2017-07-06 ENCOUNTER — Other Ambulatory Visit: Payer: Self-pay | Admitting: Gastroenterology

## 2017-07-06 DIAGNOSIS — G894 Chronic pain syndrome: Secondary | ICD-10-CM

## 2017-07-06 MED ORDER — DESIPRAMINE HCL 10 MG PO TABS: 10.0000 mg | ORAL_TABLET | Freq: Every day | ORAL | 1 refills | Status: DC

## 2017-07-06 NOTE — Telephone Encounter (Signed)
Called patient and discussed results at length. She has evidence of GEJ outflow obstruction. It's possible it's related to her symptoms, it's possible it's not. She takes oxycodone 10mg  TID, I suspect the narcotics are causing this pattern on manometry. The only way to tell is to have her stop narcotics, see how she feels, and repeat the manometry in a few months once off narcotics to see if the pattern persists. She states she takes the narcotics to function but I think over time she should be on a different regimen and wean off opioids if possible. I discussed options with her. I think a trial of a TCA such as Desipramine to treat chronic pain may be good. Recommend 10mg  q HS for 2 weeks and then increase to 20mg  q HS. She can try to wean off oxycodone slowly over this time. She will touch base with me in 4-6 weeks for an update. I counseled her this medication can take several weeks to kick in. All questions answered, she agreed with the plan.

## 2017-07-09 ENCOUNTER — Other Ambulatory Visit: Payer: Self-pay | Admitting: Gastroenterology

## 2017-07-09 DIAGNOSIS — G894 Chronic pain syndrome: Secondary | ICD-10-CM

## 2017-07-12 NOTE — Progress Notes (Signed)
PH study normal. No evidence of reflux in relation to her symptoms.

## 2017-07-17 DIAGNOSIS — R0789 Other chest pain: Secondary | ICD-10-CM

## 2017-07-17 DIAGNOSIS — R1310 Dysphagia, unspecified: Secondary | ICD-10-CM

## 2017-08-01 ENCOUNTER — Ambulatory Visit: Payer: Medicare Other | Admitting: Family Medicine

## 2017-08-09 ENCOUNTER — Ambulatory Visit (INDEPENDENT_AMBULATORY_CARE_PROVIDER_SITE_OTHER): Payer: Medicare Other | Admitting: Family Medicine

## 2017-08-09 ENCOUNTER — Telehealth: Payer: Self-pay

## 2017-08-09 ENCOUNTER — Encounter: Payer: Self-pay | Admitting: Family Medicine

## 2017-08-09 VITALS — BP 110/80 | HR 76 | Temp 97.5°F | Ht 63.0 in | Wt 177.0 lb

## 2017-08-09 DIAGNOSIS — Z7689 Persons encountering health services in other specified circumstances: Secondary | ICD-10-CM

## 2017-08-09 DIAGNOSIS — F17210 Nicotine dependence, cigarettes, uncomplicated: Secondary | ICD-10-CM | POA: Insufficient documentation

## 2017-08-09 DIAGNOSIS — E782 Mixed hyperlipidemia: Secondary | ICD-10-CM | POA: Diagnosis not present

## 2017-08-09 DIAGNOSIS — F419 Anxiety disorder, unspecified: Secondary | ICD-10-CM | POA: Diagnosis not present

## 2017-08-09 DIAGNOSIS — R1310 Dysphagia, unspecified: Secondary | ICD-10-CM | POA: Diagnosis not present

## 2017-08-09 DIAGNOSIS — G8929 Other chronic pain: Secondary | ICD-10-CM | POA: Diagnosis not present

## 2017-08-09 DIAGNOSIS — I10 Essential (primary) hypertension: Secondary | ICD-10-CM

## 2017-08-09 NOTE — Patient Instructions (Signed)
Chronic Pain, Adult Chronic pain is a type of pain that lasts or keeps coming back (recurs) for at least six months. You may have chronic headaches, abdominal pain, or body pain. Chronic pain may be related to an illness, such as fibromyalgia or complex regional pain syndrome. Sometimes the cause of chronic pain is not known. Chronic pain can make it hard for you to do daily activities. If not treated, chronic pain can lead to other health problems, including anxiety and depression. Treatment depends on the cause and severity of your pain. You may need to work with a pain specialist to come up with a treatment plan. The plan may include medicine, counseling, and physical therapy. Many people benefit from a combination of two or more types of treatment to control their pain. Follow these instructions at home: Lifestyle  Consider keeping a pain diary to share with your health care providers.  Consider talking with a mental health care provider (psychologist) about how to cope with chronic pain.  Consider joining a chronic pain support group.  Try to control or lower your stress levels. Talk to your health care provider about strategies to do this. General instructions   Take over-the-counter and prescription medicines only as told by your health care provider.  Follow your treatment plan as told by your health care provider. This may include: ? Gentle, regular exercise. ? Eating a healthy diet that includes foods such as vegetables, fruits, fish, and lean meats. ? Cognitive or behavioral therapy. ? Working with a Community education officer. ? Meditation or yoga. ? Acupuncture or massage therapy. ? Aroma, color, light, or sound therapy. ? Local electrical stimulation. ? Shots (injections) of numbing or pain-relieving medicines into the spine or the area of pain.  Check your pain level as told by your health care provider. Ask your health care provider if you should use a pain scale.  Learn as  much as you can about how to manage your chronic pain. Ask your health care provider if an intensive pain rehabilitation program or a chronic pain specialist would be helpful.  Keep all follow-up visits as told by your health care provider. This is important. Contact a health care provider if:  Your pain gets worse.  You have new pain.  You have trouble sleeping.  You have trouble doing your normal activities.  Your pain is not controlled with treatment.  Your have side effects from pain medicine.  You feel weak. Get help right away if:  You lose feeling or have numbness in your body.  You lose control of bowel or bladder function.  Your pain suddenly gets much worse.  You develop shaking or chills.  You develop confusion.  You develop chest pain.  You have trouble breathing or shortness of breath.  You pass out.  You have thoughts about hurting yourself or others. This information is not intended to replace advice given to you by your health care provider. Make sure you discuss any questions you have with your health care provider. Document Released: 10/29/2001 Document Revised: 10/07/2015 Document Reviewed: 07/27/2015 Elsevier Interactive Patient Education  2018 [place] Eating Plan DASH stands for "Dietary Approaches to Stop Hypertension." The DASH eating plan is a healthy eating plan that has been shown to reduce high blood pressure (hypertension). It may also reduce your risk for type 2 diabetes, heart disease, and stroke. The DASH eating plan may also help with weight loss. What are tips for following this plan? General guidelines  Avoid eating more than 2,300 mg (milligrams) of salt (sodium) a day. If you have hypertension, you may need to reduce your sodium intake to 1,500 mg a day.  Limit alcohol intake to no more than 1 drink a day for nonpregnant women and 2 drinks a day for men. One drink equals 12 oz of beer, 5 oz of wine, or 1 oz of hard  liquor.  Work with your health care provider to maintain a healthy body weight or to lose weight. Ask what an ideal weight is for you.  Get at least 30 minutes of exercise that causes your heart to beat faster (aerobic exercise) most days of the week. Activities may include walking, swimming, or biking.  Work with your health care provider or diet and nutrition specialist (dietitian) to adjust your eating plan to your individual calorie needs. Reading food labels  Check food labels for the amount of sodium per serving. Choose foods with less than 5 percent of the Daily Value of sodium. Generally, foods with less than 300 mg of sodium per serving fit into this eating plan.  To find whole grains, look for the word "whole" as the first word in the ingredient list. Shopping  Buy products labeled as "low-sodium" or "no salt added."  Buy fresh foods. Avoid canned foods and premade or frozen meals. Cooking  Avoid adding salt when cooking. Use salt-free seasonings or herbs instead of table salt or sea salt. Check with your health care provider or pharmacist before using salt substitutes.  Do not fry foods. Cook foods using healthy methods such as baking, boiling, grilling, and broiling instead.  Cook with heart-healthy oils, such as olive, canola, soybean, or sunflower oil. Meal planning   Eat a balanced diet that includes: ? 5 or more servings of fruits and vegetables each day. At each meal, try to fill half of your plate with fruits and vegetables. ? Up to 6-8 servings of whole grains each day. ? Less than 6 oz of lean meat, poultry, or fish each day. A 3-oz serving of meat is about the same size as a deck of cards. One egg equals 1 oz. ? 2 servings of low-fat dairy each day. ? A serving of nuts, seeds, or beans 5 times each week. ? Heart-healthy fats. Healthy fats called Omega-3 fatty acids are found in foods such as flaxseeds and coldwater fish, like sardines, salmon, and  mackerel.  Limit how much you eat of the following: ? Canned or prepackaged foods. ? Food that is high in trans fat, such as fried foods. ? Food that is high in saturated fat, such as fatty meat. ? Sweets, desserts, sugary drinks, and other foods with added sugar. ? Full-fat dairy products.  Do not salt foods before eating.  Try to eat at least 2 vegetarian meals each week.  Eat more home-cooked food and less restaurant, buffet, and fast food.  When eating at a restaurant, ask that your food be prepared with less salt or no salt, if possible. What foods are recommended? The items listed may not be a complete list. Talk with your dietitian about what dietary choices are best for you. Grains Whole-grain or whole-wheat bread. Whole-grain or whole-wheat pasta. Brown rice. [place]. Bulgur. Whole-grain and low-sodium cereals. Pita bread. Low-fat, low-sodium crackers. Whole-wheat flour tortillas. Vegetables Fresh or frozen vegetables (raw, steamed, roasted, or grilled). Low-sodium or reduced-sodium tomato and vegetable juice. Low-sodium or reduced-sodium tomato sauce and tomato paste. Low-sodium or reduced-sodium canned vegetables. Fruits  All fresh, dried, or frozen fruit. Canned fruit in natural juice (without added sugar). Meat and other protein foods Skinless chicken or [place]. Ground chicken or [place]. Pork with fat trimmed off. Fish and seafood. Egg whites. Dried beans, peas, or lentils. Unsalted nuts, nut butters, and seeds. Unsalted canned beans. Lean cuts of beef with fat trimmed off. Low-sodium, lean deli meat. Dairy Low-fat (1%) or fat-free (skim) milk. Fat-free, low-fat, or reduced-fat cheeses. Nonfat, low-sodium ricotta or cottage cheese. Low-fat or nonfat yogurt. Low-fat, low-sodium cheese. Fats and oils Soft margarine without trans fats. Vegetable oil. Low-fat, reduced-fat, or light mayonnaise and salad dressings (reduced-sodium). Canola, safflower, olive, soybean, and  sunflower oils. Avocado. Seasoning and other foods Herbs. Spices. Seasoning mixes without salt. Unsalted popcorn and pretzels. Fat-free sweets. What foods are not recommended? The items listed may not be a complete list. Talk with your dietitian about what dietary choices are best for you. Grains Baked goods made with fat, such as croissants, muffins, or some breads. Dry pasta or rice meal packs. Vegetables Creamed or fried vegetables. Vegetables in a cheese sauce. Regular canned vegetables (not low-sodium or reduced-sodium). Regular canned tomato sauce and paste (not low-sodium or reduced-sodium). Regular tomato and vegetable juice (not low-sodium or reduced-sodium). Angie Fava. Olives. Fruits Canned fruit in a light or heavy syrup. Fried fruit. Fruit in cream or butter sauce. Meat and other protein foods Fatty cuts of meat. Ribs. Fried meat. Berniece Salines. Sausage. Bologna and other processed lunch meats. Salami. Fatback. Hotdogs. Bratwurst. Salted nuts and seeds. Canned beans with added salt. Canned or smoked fish. Whole eggs or egg yolks. Chicken or [place] with skin. Dairy Whole or 2% milk, cream, and half-and-half. Whole or full-fat cream cheese. Whole-fat or sweetened yogurt. Full-fat cheese. Nondairy creamers. Whipped toppings. Processed cheese and cheese spreads. Fats and oils Butter. Stick margarine. Lard. Shortening. Ghee. Bacon fat. Tropical oils, such as coconut, palm kernel, or palm oil. Seasoning and other foods Salted popcorn and pretzels. Onion salt, garlic salt, seasoned salt, table salt, and sea salt. Worcestershire sauce. Tartar sauce. Barbecue sauce. Teriyaki sauce. Soy sauce, including reduced-sodium. Steak sauce. Canned and packaged gravies. Fish sauce. Oyster sauce. Cocktail sauce. Horseradish that you find on the shelf. Ketchup. Mustard. Meat flavorings and tenderizers. Bouillon cubes. Hot sauce and Tabasco sauce. Premade or packaged marinades. Premade or packaged taco seasonings.  Relishes. Regular salad dressings. Where to find more information:  National Heart, Lung, and [place]: https://wilson-eaton.com/  American Heart Association: www.heart.org Summary  The DASH eating plan is a healthy eating plan that has been shown to reduce high blood pressure (hypertension). It may also reduce your risk for type 2 diabetes, heart disease, and stroke.  With the DASH eating plan, you should limit salt (sodium) intake to 2,300 mg a day. If you have hypertension, you may need to reduce your sodium intake to 1,500 mg a day.  When on the DASH eating plan, aim to eat more fresh fruits and vegetables, whole grains, lean proteins, low-fat dairy, and heart-healthy fats.  Work with your health care provider or diet and nutrition specialist (dietitian) to adjust your eating plan to your individual calorie needs. This information is not intended to replace advice given to you by your health care provider. Make sure you discuss any questions you have with your health care provider. Document Released: 01/26/2011 Document Revised: 01/31/2016 Document Reviewed: 01/31/2016 Elsevier Interactive Patient Education  [employer].
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
## 2017-08-09 NOTE — Telephone Encounter (Signed)
Pt requesting Linzess Rx from her GI dr.

## 2017-08-09 NOTE — Telephone Encounter (Signed)
A telephone note was routed to dr Havery Moros requesting for refills on pt Linzess

## 2017-08-09 NOTE — Telephone Encounter (Signed)
Jill Newton can you help refill [name]? Thanks much

## 2017-08-10 MED ORDER — LINACLOTIDE 72 MCG PO CAPS: 72.0000 ug | ORAL_CAPSULE | Freq: Every day | ORAL | 5 refills | Status: DC

## 2017-08-10 NOTE — Telephone Encounter (Signed)
Rx filled

## 2017-08-10 NOTE — Addendum Note (Signed)
Addended by: Roetta Sessions on: 08/10/2017 08:23 AM   Modules accepted: Orders

## 2017-08-13 ENCOUNTER — Telehealth: Payer: Self-pay | Admitting: *Deleted

## 2017-08-13 NOTE — Telephone Encounter (Signed)
Referral placed 08/10/17.  Pt is needing meds.   Type Date User Summary Attachment  General 08/10/2017 10:22 AM Carney Bern - -  Note   Sent to work que - once review pt will get a call to schedule 3-4 weeks for review  [facility] Medical clinic in [place] Address: [address], [place], [place] [address] Phone: [phone]

## 2017-08-13 NOTE — Telephone Encounter (Signed)
Copied from [place] [phone]. Topic: Referral - Status >> Aug 13, 2017 10:16 AM Synthia Innocent wrote: Reason for CRM: Checking status of pain management referral, out of pain meds, pain to much to handle. Please advise

## 2017-08-15 NOTE — Telephone Encounter (Signed)
Please advise Dr Volanda Napoleon if you can recommend another Pain Clinic or are okay with referring patient to the [facility] location mentioned below. Thanks.    Copied from [place] [phone]. Topic: Referral - Request >> Aug 15, 2017 11:14 AM Jill Newton wrote: Reason for CRM: Patient called stating that the Pain Management Clinic where her referral was sent cannot see her for 4 to 5 weeks. Patient would like a referral to another Pain Management Clinic. Patient states that there is another pain management clinic in [place] also.   Please call patient at [phone].         Thank You!!!

## 2017-08-15 NOTE — Telephone Encounter (Signed)
Jill Newton the referral coordinator checked pt referral status, states that referral is under review which will take 3-4 weeks, Pt notified and provided with Jill Newton pain clinic phone number to contact the office. Pt voiced understading.

## 2017-08-16 ENCOUNTER — Telehealth: Payer: Self-pay | Admitting: Gastroenterology

## 2017-08-16 NOTE — Telephone Encounter (Signed)
Jill Newton are you referring to her chest pain / dysphagia symptoms? She was supposed to titrate up to 20mg  q HS of the desipramine and hoped to titrate down narcotics. Is she still using same narcotic dose? She may need a clinic follow up to discuss this further, as it's a difficult situation and can be challenging to treat.

## 2017-08-16 NOTE — Telephone Encounter (Signed)
Routed to Dr. Armbruster. 

## 2017-08-16 NOTE — Telephone Encounter (Signed)
Patient called about her referral . Please see below message. [facility]  is where she would like to be referred to . Office number # [phone]

## 2017-08-17 NOTE — Telephone Encounter (Signed)
Spoke to patient, she is not having any dysphagia she is having sternal chest pain. She is taking the desipramine 20 mg qhs, she ran out of her narcotic pain medication. Patient states that her PCP will not give her any more, looking at their OV note, they placed referral for Pain Clinic to help manage her pain issues. Your next available office visit is not until 8/30, please advise on scheduling. Thank you.

## 2017-08-17 NOTE — Telephone Encounter (Signed)
Left message for patient to call back  

## 2017-08-17 NOTE — Telephone Encounter (Signed)
Spoke to patient she understands to increase the desipramine to 50 mg q HS. Instructed to give it a good week to see if it is helping and to call back with an update.

## 2017-08-17 NOTE — Telephone Encounter (Signed)
Good to hear the dysphagia is improved. 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 Regarding her pain, if she is tolerating desipramine 20mg  q HS, can try increasing this to 50mg  q HS and see if this helps further. Regarding narcotics that will have to come from pain management as our clinic does not prescribe those medications for chronic use. If she needs narcotics prior to pain management evaluation that would have to come from her primary care. You can book her the August appointment.

## 2017-08-17 NOTE — Telephone Encounter (Signed)
Please Advise

## 2017-08-27 ENCOUNTER — Telehealth: Payer: Self-pay | Admitting: Gastroenterology

## 2017-08-27 NOTE — Telephone Encounter (Signed)
Do you want to refill the Norpramin?  Quantity and any refills?  Thank you.

## 2017-08-27 NOTE — Telephone Encounter (Signed)
Please advise if any recommendations or meds that can be sent in meantime for pain until patient is able to be seen by pain management?

## 2017-08-27 NOTE — Telephone Encounter (Signed)
I spoke with the patient and she has a "spot" on her chest that is painful.  It is not new or different.  She is going to see if the increase in the Norpramin will help and report back in a few days.  Her PCP is working on getting her referred for pain management

## 2017-08-27 NOTE — Telephone Encounter (Signed)
Patient calling back because there has not been a referral sent to the pain management office in [place]. She would like a call back to find out what is going on with her referral because she is still in a lot of pain to the point that she is not sleeping. Wants suggestions on what she can take.

## 2017-08-27 NOTE — Telephone Encounter (Signed)
Please Advise

## 2017-08-27 NOTE — Telephone Encounter (Signed)
Yes we can refill it. I had previously discussed with Almyra Free last week, not sure if it was put in the pharmacy. She was on 20mg  dosing, I recommended she increase it to 50mg  q HS if tolerated, if you can give her #[phone] [phone]. Thanks

## 2017-08-27 NOTE — Telephone Encounter (Addendum)
Says she is completely out of the little blue pill that nurse Pam told her to take. Can we send refill please?

## 2017-08-28 ENCOUNTER — Other Ambulatory Visit: Payer: Self-pay

## 2017-08-28 MED ORDER — DESIPRAMINE HCL 10 MG PO TABS: ORAL_TABLET | ORAL | 11 refills | Status: DC

## 2017-08-28 NOTE — Telephone Encounter (Signed)
Patient notified that prescription has been sent to her pharmacy.

## 2017-08-31 ENCOUNTER — Telehealth: Payer: Self-pay | Admitting: Family Medicine

## 2017-08-31 NOTE — Telephone Encounter (Signed)
Pt needs her Pain Clinic referral to Chilton Greathouse, NP [facility] [place] [phone]

## 2017-08-31 NOTE — Telephone Encounter (Signed)
Copied from [place] [phone]. Topic: Referral - Status >> Aug 31, 2017 10:38 AM Rutherford Nail, NT wrote: Reason for CRM: Patient calling to speak with the referral coordinator about a pain management referral. Coordinator out of the office today. Patient would like to know if she was able to get in contact with the pain management office that she talked with her about. Was an appointment made? Please advise. CB#: [phone]

## 2017-09-03 ENCOUNTER — Emergency Department (HOSPITAL_COMMUNITY)
Admission: EM | Admit: 2017-09-03 | Discharge: 2017-09-03 | Disposition: A | Discharge status: Home / Self Care | Payer: Medicare Other | Attending: Emergency Medicine | Admitting: Emergency Medicine

## 2017-09-03 ENCOUNTER — Emergency Department (HOSPITAL_COMMUNITY): Payer: Medicare Other

## 2017-09-03 ENCOUNTER — Encounter (HOSPITAL_COMMUNITY): Payer: Self-pay | Admitting: Emergency Medicine

## 2017-09-03 DIAGNOSIS — K209 Esophagitis, unspecified without bleeding: Secondary | ICD-10-CM

## 2017-09-03 DIAGNOSIS — I10 Essential (primary) hypertension: Secondary | ICD-10-CM | POA: Insufficient documentation

## 2017-09-03 DIAGNOSIS — G8929 Other chronic pain: Secondary | ICD-10-CM | POA: Insufficient documentation

## 2017-09-03 DIAGNOSIS — F17210 Nicotine dependence, cigarettes, uncomplicated: Secondary | ICD-10-CM | POA: Insufficient documentation

## 2017-09-03 DIAGNOSIS — Z79899 Other long term (current) drug therapy: Secondary | ICD-10-CM | POA: Diagnosis not present

## 2017-09-03 DIAGNOSIS — J449 Chronic obstructive pulmonary disease, unspecified: Secondary | ICD-10-CM | POA: Insufficient documentation

## 2017-09-03 DIAGNOSIS — R0789 Other chest pain: Secondary | ICD-10-CM | POA: Insufficient documentation

## 2017-09-03 LAB — BASIC METABOLIC PANEL
Anion gap: 10 (ref 5–15)
BUN: 21 mg/dL (ref 8–23)
CALCIUM: 9.5 mg/dL (ref 8.9–10.3)
CO2: 27 mmol/L (ref 22–32)
CREATININE: 1.08 mg/dL — AB (ref 0.44–1.00)
Chloride: 102 mmol/L (ref 98–111)
GFR calc Af Amer: >60 mL/min (ref >60)
GFR calc non Af Amer: 54 mL/min — ABNORMAL LOW (ref >60)
GLUCOSE: 137 mg/dL — AB (ref 70–99)
Potassium: 3.4 mmol/L — ABNORMAL LOW (ref 3.5–5.1)
Sodium: 139 mmol/L (ref 135–145)

## 2017-09-03 LAB — CBC
HCT: 40.2 % (ref 36.0–46.0)
Hemoglobin: 13.4 g/dL (ref 12.0–15.0)
MCH: 27.5 pg (ref 26.0–34.0)
MCHC: 33.3 g/dL (ref 30.0–36.0)
MCV: 82.4 fL (ref 78.0–100.0)
PLATELETS: 184 10*3/uL (ref 150–400)
RBC: 4.88 MIL/uL (ref 3.87–5.11)
RDW: 15.7 % — AB (ref 11.5–15.5)
WBC: 3 10*3/uL — ABNORMAL LOW (ref 4.0–10.5)

## 2017-09-03 LAB — I-STAT TROPONIN, ED: TROPONIN I, POC: 0 ng/mL (ref 0.00–0.08)

## 2017-09-03 MED ORDER — SUCRALFATE 1 GM/10ML PO SUSP: 1.0000 g | Freq: Three times a day (TID) | ORAL | 0 refills | Status: DC

## 2017-09-03 MED ORDER — GI COCKTAIL ~~LOC~~
30.0000 mL | Freq: Once | ORAL | Status: AC
  Administered 2017-09-03: 30 mL via ORAL
  Filled 2017-09-03: qty 30
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
## 2017-09-03 NOTE — Telephone Encounter (Signed)
Pt was released for non compliance issue in 07/05/2017 at the [facility] office she was seen by Kathi Ludwig PA. Pt is unable to be seen at [facility] due to issue.

## 2017-09-03 NOTE — Telephone Encounter (Signed)
Was told to fax the referral to  Pain Clinic referral to Chilton Greathouse, NP [facility] [phone] Fax [phone]- I called the patient to inform that I was out of the office on 08-31-2017 and I called Vangie Bicker office to get the fax number to fax over notes . Once reviewed their office will call to schedule pt directly and Inform our office

## 2017-09-03 NOTE — Telephone Encounter (Signed)
Spoke with patient. Aware that she will need to research other options and let us know. Pt aware that she is not able to be seen by [facility] d/t being discharged by one of their sister sites. Pt states that she is on her way to the hospital now to get some pain relief because she "cannot live with this pain any longer" - pt advised to ask for recommendations by ED staff and to let us know if she needs a referral placed.   Will send to Dr Volanda Napoleon to see if there are any further recommendations for the patient.

## 2017-09-03 NOTE — ED Notes (Signed)
Pt upset and frustrated about no relief from pain. Pt wanted referral to Pain clinic. Pt informed that the ED cannot do pain clinic referrals or manage chronic pain. Pt provided with prescription for  and verbalizes understanding of prescription and discharge instructions.

## 2017-09-03 NOTE — ED Triage Notes (Signed)
Patient c/o constant sharp right side chest pain x5 years. Hx GERD. Speaking in full sentences.

## 2017-09-03 NOTE — Discharge Instructions (Signed)
See your primary care MD for recheck and referral to pain management.

## 2017-09-04 NOTE — Telephone Encounter (Signed)
Heat, massage, biofreeze can be used for pt's pain.

## 2017-09-05 NOTE — Telephone Encounter (Signed)
Spoke with pt voiced understanding that she can get some relief from using Bio freezing,and heat massage. Pt stated that she is waiting on a call from pain management clinic

## 2017-09-10 ENCOUNTER — Telehealth: Payer: Self-pay | Admitting: Gastroenterology

## 2017-09-10 NOTE — Telephone Encounter (Signed)
Routed to Dr. Armbruster. 

## 2017-09-11 NOTE — Telephone Encounter (Signed)
Left message for patient to call back  

## 2017-09-11 NOTE — Telephone Encounter (Signed)
Jill Newton can you clarify how much desipramine she is taking? Should be dosed at 50mg  (she was previously on 10mg ). From our last discussion she was referred to pain management for long term pain control, can you see if she has seen them and what they have given her for this? Thanks

## 2017-09-11 NOTE — Telephone Encounter (Signed)
Okay thanks for clarifying. It is unclear what is causing her chronic chest pain, I'm not convinced this is her esophagus. I think pain management will need to manage her pain medications moving forward if they are going to use narcotics, which she has required in the past. I will see her in follow up after that visit. If she does not think the desipramine has helped at all she can stop it, if she finds it has provided some benefit she can continue

## 2017-09-11 NOTE — Telephone Encounter (Signed)
Patient is taking desipramine 50 mg qhs and still in a lot of pain. She has her pain management appointment on 09/19/17 and follow up here on 10/19/17.

## 2017-09-12 NOTE — Telephone Encounter (Signed)
Patient advised to keep her appointment with pain management and may stop the desipramine if she feels it is not helping. She will keep her follow up appointment here in August.

## 2017-09-20 ENCOUNTER — Telehealth: Payer: Self-pay | Admitting: Gastroenterology

## 2017-09-20 NOTE — Telephone Encounter (Signed)
Pt called and said she went to pain management and saw Dr. Carole Binning on [facility].  He would not give her anything for pain because he didn't have a diagnosis. He indicated he would call Dr. Havery Moros to discuss and will fax over his note.  She is asking for pain medication.  I let her know I will look for the fax and be sure to pass it to Dr.A when he returns from the hospital. Hopefully Dr. Gwyndolyn Saxon and Dr. Loni Muse can connect to see if pain management can help her manage her symptoms.

## 2017-09-21 NOTE — Telephone Encounter (Signed)
Jill Newton she has chronic chest pain of unclear etiology. I'm not convinced it is her esophagus. I would be happy to chat with her pain management physician. She can also follow up with per PCP for this issue

## 2017-09-21 NOTE — Telephone Encounter (Signed)
Ive spoken to patient to advise of Dr Doyne Keel response and she verbalizes understanding.

## 2017-09-21 NOTE — Telephone Encounter (Signed)
Left voicemail for patient to call back. 

## 2017-09-21 NOTE — Telephone Encounter (Signed)
Pt returning phone call best call back # [phone].

## 2017-09-28 NOTE — Telephone Encounter (Signed)
Called pt left a detailed message regarding her referral to pain management.

## 2017-09-28 NOTE — Telephone Encounter (Addendum)
Patient is checking the status of her referral for Jill Newton in [place], [place] Address: [address], [place], [place] [address] Phone: [phone] They have stated that they have no record of receiving a referral for her. Please advise.

## 2017-10-01 ENCOUNTER — Other Ambulatory Visit: Payer: Self-pay

## 2017-10-01 ENCOUNTER — Other Ambulatory Visit: Payer: Self-pay | Admitting: Family Medicine

## 2017-10-01 MED ORDER — LOSARTAN POTASSIUM-HCTZ 100-25 MG PO TABS
1.0000 | ORAL_TABLET | Freq: Every day | ORAL | 1 refills | Status: DC
Start: 2017-10-01 — End: 2017-10-01

## 2017-10-01 NOTE — Telephone Encounter (Signed)
Spoke with pt voiced understanding that her referral is still on review.

## 2017-10-04 ENCOUNTER — Encounter (HOSPITAL_COMMUNITY): Payer: Self-pay | Admitting: Emergency Medicine

## 2017-10-04 ENCOUNTER — Emergency Department (HOSPITAL_COMMUNITY)
Admission: EM | Admit: 2017-10-04 | Discharge: 2017-10-04 | Disposition: A | Discharge status: Home / Self Care | Payer: Medicare Other | Attending: Emergency Medicine | Admitting: Emergency Medicine

## 2017-10-04 ENCOUNTER — Other Ambulatory Visit: Payer: Self-pay

## 2017-10-04 ENCOUNTER — Emergency Department (HOSPITAL_COMMUNITY): Payer: Medicare Other

## 2017-10-04 DIAGNOSIS — I10 Essential (primary) hypertension: Secondary | ICD-10-CM | POA: Insufficient documentation

## 2017-10-04 DIAGNOSIS — Z79899 Other long term (current) drug therapy: Secondary | ICD-10-CM | POA: Diagnosis not present

## 2017-10-04 DIAGNOSIS — R079 Chest pain, unspecified: Secondary | ICD-10-CM | POA: Diagnosis present

## 2017-10-04 DIAGNOSIS — J441 Chronic obstructive pulmonary disease with (acute) exacerbation: Secondary | ICD-10-CM | POA: Insufficient documentation

## 2017-10-04 DIAGNOSIS — F17210 Nicotine dependence, cigarettes, uncomplicated: Secondary | ICD-10-CM | POA: Diagnosis not present

## 2017-10-04 LAB — I-STAT CHEM 8, ED
BUN: 18 mg/dL (ref 8–23)
CALCIUM ION: 1.08 mmol/L — AB (ref 1.15–1.40)
CHLORIDE: 106 mmol/L (ref 98–111)
Creatinine, Ser: 0.8 mg/dL (ref 0.44–1.00)
Glucose, Bld: 109 mg/dL — ABNORMAL HIGH (ref 70–99)
HEMATOCRIT: 40 % (ref 36.0–46.0)
Hemoglobin: 13.6 g/dL (ref 12.0–15.0)
Potassium: 3.8 mmol/L (ref 3.5–5.1)
SODIUM: 138 mmol/L (ref 135–145)
TCO2: 23 mmol/L (ref 22–32)

## 2017-10-04 LAB — CBC WITH DIFFERENTIAL/PLATELET
ABS IMMATURE GRANULOCYTES: 0 10*3/uL (ref 0.0–0.1)
Basophils Absolute: 0 10*3/uL (ref 0.0–0.1)
Basophils Relative: 0 %
Eosinophils Absolute: 0.1 10*3/uL (ref 0.0–0.7)
Eosinophils Relative: 2 %
HEMATOCRIT: 38.4 % (ref 36.0–46.0)
Hemoglobin: 12.3 g/dL (ref 12.0–15.0)
Immature Granulocytes: 0 %
LYMPHS ABS: 1.5 10*3/uL (ref 0.7–4.0)
LYMPHS PCT: 35 %
MCH: 26.9 pg (ref 26.0–34.0)
MCHC: 32 g/dL (ref 30.0–36.0)
MCV: 84 fL (ref 78.0–100.0)
MONOS PCT: 11 %
Monocytes Absolute: 0.5 10*3/uL (ref 0.1–1.0)
NEUTROS ABS: 2.1 10*3/uL (ref 1.7–7.7)
Neutrophils Relative %: 52 %
Platelets: 163 10*3/uL (ref 150–400)
RBC: 4.57 MIL/uL (ref 3.87–5.11)
RDW: 16.5 % — ABNORMAL HIGH (ref 11.5–15.5)
WBC: 4.2 10*3/uL (ref 4.0–10.5)

## 2017-10-04 LAB — I-STAT TROPONIN, ED
Troponin i, poc: 0 ng/mL (ref 0.00–0.08)
Troponin i, poc: 0 ng/mL (ref 0.00–0.08)

## 2017-10-04 LAB — RAPID URINE DRUG SCREEN, HOSP PERFORMED
Amphetamines: NOT DETECTED
BARBITURATES: NOT DETECTED
BENZODIAZEPINES: POSITIVE — AB
COCAINE: NOT DETECTED
OPIATES: NOT DETECTED
TETRAHYDROCANNABINOL: POSITIVE — AB

## 2017-10-04 MED ORDER — IPRATROPIUM BROMIDE 0.02 % IN SOLN
0.5000 mg | Freq: Once | RESPIRATORY_TRACT | Status: AC
  Administered 2017-10-04: 0.5 mg via RESPIRATORY_TRACT
  Filled 2017-10-04: qty 2.5

## 2017-10-04 MED ORDER — PREDNISONE 20 MG PO TABS: ORAL_TABLET | ORAL | 0 refills | Status: DC

## 2017-10-04 MED ORDER — SUCRALFATE 1 GM/10ML PO SUSP
1.0000 g | Freq: Three times a day (TID) | ORAL | Status: DC
  Filled 2017-10-04 (×2): qty 10

## 2017-10-04 MED ORDER — KETOROLAC TROMETHAMINE 30 MG/ML IJ SOLN: 30.0000 mg | Freq: Once | INTRAMUSCULAR | Status: DC

## 2017-10-04 MED ORDER — ALBUTEROL SULFATE (2.5 MG/3ML) 0.083% IN NEBU
5.0000 mg | INHALATION_SOLUTION | Freq: Once | RESPIRATORY_TRACT | Status: AC
  Administered 2017-10-04: 5 mg via RESPIRATORY_TRACT
  Filled 2017-10-04: qty 6

## 2017-10-04 MED ORDER — GI COCKTAIL ~~LOC~~
30.0000 mL | Freq: Once | ORAL | Status: AC
  Administered 2017-10-04: 30 mL via ORAL
  Filled 2017-10-04: qty 30

## 2017-10-04 MED ORDER — ALBUTEROL SULFATE HFA 108 (90 BASE) MCG/ACT IN AERS: 1.0000 | INHALATION_SPRAY | Freq: Four times a day (QID) | RESPIRATORY_TRACT | 0 refills | Status: AC | PRN

## 2017-10-04 MED ORDER — KETOROLAC TROMETHAMINE 15 MG/ML IJ SOLN
15.0000 mg | Freq: Once | INTRAMUSCULAR | Status: AC
  Administered 2017-10-04: 15 mg via INTRAVENOUS

## 2017-10-04 MED ORDER — KETOROLAC TROMETHAMINE 15 MG/ML IJ SOLN
INTRAMUSCULAR | Status: AC
  Filled 2017-10-04: qty 1

## 2017-10-04 MED ORDER — ACETAMINOPHEN 500 MG PO TABS
1000.0000 mg | ORAL_TABLET | Freq: Once | ORAL | Status: AC
  Administered 2017-10-04: 1000 mg via ORAL
  Filled 2017-10-04: qty 2

## 2017-10-04 MED ORDER — PREDNISONE 20 MG PO TABS
60.0000 mg | ORAL_TABLET | Freq: Once | ORAL | Status: AC
  Administered 2017-10-04: 60 mg via ORAL
  Filled 2017-10-04: qty 3

## 2017-10-04 MED ORDER — ONDANSETRON HCL 4 MG/2ML IJ SOLN
4.0000 mg | Freq: Once | INTRAMUSCULAR | Status: AC
  Administered 2017-10-04: 4 mg via INTRAVENOUS
  Filled 2017-10-04: qty 2

## 2017-10-04 MED ORDER — OMEPRAZOLE 20 MG PO CPDR: 20.0000 mg | DELAYED_RELEASE_CAPSULE | Freq: Every day | ORAL | 0 refills | Status: DC

## 2017-10-04 NOTE — ED Triage Notes (Signed)
Pt BIB [facility], c/o chest pain x "years", increasing in the last few days. Pt reports that she has been without her THC x 2 days. Hypertensive with EMS, hx of the same.

## 2017-10-04 NOTE — ED Notes (Signed)
Pt given 1 NTG PTA with no change in pain.

## 2017-10-04 NOTE — ED Notes (Signed)
C/o increased pressure in her chest, states when her blood pressure goes up her chest hurts worse.

## 2017-10-04 NOTE — ED Notes (Signed)
Pt c/o nausea and lightheadedness.

## 2017-10-04 NOTE — ED Provider Notes (Signed)
[facility] EMERGENCY DEPARTMENT Provider Note   CSN: [id number] Arrival date & time: 10/04/17  0038     History   Chief Complaint Chief Complaint  Patient presents with  . Chest Pain    HPI Jill Newton is a 62 y.o. female.  The history is provided by the patient. No language interpreter was used.  Chest Pain   This is a chronic problem. The current episode started more than 1 week ago. The problem occurs constantly. The problem has been gradually worsening. The pain is associated with rest. The pain is present in the substernal region. The pain is at a severity of 7/10. The pain is severe. The quality of the pain is described as dull. The pain does not radiate. Exacerbated by: none, worse when she is off her chronic pain medication and marijuana and worse since she started smoking cigarettes again. Pertinent negatives include no abdominal pain, no back pain, no claudication, no cough, no diaphoresis, no dizziness, no fever, no hemoptysis, no irregular heartbeat, no leg pain, no lower extremity edema, no nausea, no near-syncope, no numbness, no orthopnea, no palpitations and no sputum production. Associated symptoms comments: Wheezing, also feels worse when she eats and says something is poking out in her chest . She has tried nothing for the symptoms. The treatment provided no relief. Risk factors include smoking/tobacco exposure.  Her past medical history is significant for COPD.  Pertinent negatives for past medical history include no MI and no mitral valve prolapse.  Pertinent negatives for family medical history include: no aortic dissection.  Ongoing for months but no one can tell her why.  Not her PMD nor GI.  She sees pain management and is out of her medication and the pain is intolerable.  No n/v/d.  No exertional symptoms.  No long car trips or plane trips no leg pain.    Past Medical History:  Diagnosis Date  . Anxiety   . Chest pain   . COPD  (chronic obstructive pulmonary disease) (HCC)    Wheezing, SOB  . Depression   . Dyspnea    When going up inclines  . Edema    In ankles. Since going off meds  . Enlarged heart   . GERD (gastroesophageal reflux disease)   . Hyperlipidemia   . Hypertension   . Migraine   . Palpitations   . Peptic ulcer     Patient Active Problem List   Diagnosis Date Noted  . Mixed hyperlipidemia 08/09/2017  . Anxiety 08/09/2017  . Cigarette nicotine dependence without complication 61/44/3154  . Dysphagia   . Atypical chest pain   . Cigarette smoker 06/03/2016  . COPD  GOLD 0 active smoker   . Essential hypertension   . Enlarged heart   . GERD (gastroesophageal reflux disease)   . Chest pain     Past Surgical History:  Procedure Laterality Date  . [place] STUDY N/A 06/20/2017   Procedure: [place] STUDY;  Surgeon: Yetta Flock, MD;  Location: [facility];  Service: Gastroenterology;  Laterality: N/A;  . CARDIAC CATHETERIZATION    . ESOPHAGEAL MANOMETRY N/A 06/20/2017   Procedure: ESOPHAGEAL MANOMETRY (EM);  Surgeon: Yetta Flock, MD;  Location: [facility];  Service: Gastroenterology;  Laterality: N/A;  . TUBAL LIGATION       OB History   None      Home Medications    Prior to Admission medications   Medication Sig Start Date End  Date Taking? Authorizing Provider  albuterol (PROVENTIL HFA;VENTOLIN HFA) 108 (90 BASE) MCG/ACT inhaler Inhale 2 puffs into the lungs 3 (three) times daily as needed. For shortness of breath   Yes [provider]  atorvastatin (LIPITOR) 10 MG tablet Take 10 mg by mouth daily. 06/30/17  Yes [provider]  losartan-hydrochlorothiazide (HYZAAR) 100-25 MG tablet TAKE 1 TABLET BY MOUTH DAILY 10/01/17  Yes Billie Ruddy, MD  Vitamin D, Ergocalciferol, (DRISDOL) 50000 units CAPS capsule Take 1 capsule by mouth once a week. 08/07/17  Yes [provider]  albuterol (PROVENTIL HFA;VENTOLIN HFA) 108 (90 Base) MCG/ACT  inhaler Inhale 1-2 puffs into the lungs every 6 (six) hours as needed for wheezing or shortness of breath. 10/04/17   Leather Estis, MD  ALPRAZolam Duanne Moron) 0.5 MG tablet Take 1 tablet (0.5 mg total) by mouth at bedtime as needed. Patient not taking: Reported on 10/04/2017 06/05/16   Tanda Rockers, MD  desipramine (NORPRAMIN) 10 MG tablet Take 5 tablets (total dose of 50 mg) at bedtime Patient not taking: Reported on 10/04/2017 08/28/17   Yetta Flock, MD  omeprazole (PRILOSEC) 20 MG capsule Take 1 capsule (20 mg total) by mouth daily. 10/04/17   Harvest Stanco, MD  predniSONE (DELTASONE) 20 MG tablet 3 tabs po day one, then 2 po daily x 4 days 10/04/17   Sade Hollon, MD  sucralfate (CARAFATE) 1 GM/10ML suspension Take 10 mLs (1 g total) by mouth 4 (four) times daily -  with meals and at bedtime. Patient not taking: Reported on 10/04/2017 09/03/17   Sidney Ace    Family History Family History  Problem Relation Age of Onset  . Congestive Heart Failure Mother   . Diabetes Mother   . Hypertension Mother   . Stroke Father   . Cancer Brother        type unknown  . Heart disease Sister   . Bone cancer Maternal Aunt   . Cancer Maternal Uncle        type unknown  . Diabetes Sister     Social History Social History   Tobacco Use  . Smoking status: Current Every Day Smoker    Packs/day: 0.50    Years: 46.00    Pack years: 23.00    Types: Cigarettes  . Smokeless tobacco: Never Used  Substance Use Topics  . Alcohol use: No  . Drug use: Yes    Types: Marijuana     Allergies   Codeine   Review of Systems Review of Systems  Constitutional: Positive for appetite change. Negative for diaphoresis and fever.  Eyes: Negative for photophobia and visual disturbance.  Respiratory: Positive for wheezing. Negative for cough, hemoptysis, sputum production and chest tightness.   Cardiovascular: Positive for chest pain. Negative for palpitations, orthopnea, claudication, leg  swelling and near-syncope.  Gastrointestinal: Negative for abdominal pain and nausea.  Genitourinary: Negative for flank pain.  Musculoskeletal: Negative for arthralgias, back pain and neck pain.  Neurological: Negative for dizziness, speech difficulty and numbness.  All other systems reviewed and are negative.    Physical Exam Updated Vital Signs BP (!) 166/83 (BP Location: Right Arm)   Pulse (!) 54   Temp (!) 96.8 F (36 C) (Temporal)   Resp 17   SpO2 100%   Physical Exam  Constitutional: She is oriented to person, place, and time. She appears well-developed and well-nourished. No distress.  HENT:  Head: Normocephalic and atraumatic.  Mouth/Throat: Oropharynx is clear and moist. No oropharyngeal  exudate.  Eyes: Pupils are equal, round, and reactive to light. Conjunctivae are normal.  Neck: Normal range of motion. Neck supple.  Cardiovascular: Normal rate, regular rhythm, normal heart sounds and intact distal pulses.  Pulmonary/Chest: No stridor. She has wheezes. She has no rales.  Abdominal: Soft. Bowel sounds are normal. She exhibits no mass. There is no tenderness. There is no rebound and no guarding.  Musculoskeletal: Normal range of motion. She exhibits no tenderness or deformity.  Neurological: She is alert and oriented to person, place, and time. She displays normal reflexes.  Skin: Skin is warm and dry. Capillary refill takes less than 2 seconds.  Psychiatric: She has a normal mood and affect.  Nursing note and vitals reviewed.    ED Treatments / Results  Labs (all labs ordered are listed, but only abnormal results are displayed) Results for orders placed or performed during the hospital encounter of 10/04/17  CBC with Differential  Result Value Ref Range   WBC 4.2 4.0 - 10.5 K/uL   RBC 4.57 3.87 - 5.11 MIL/uL   Hemoglobin 12.3 12.0 - 15.0 g/dL   HCT 38.4 36.0 - 46.0 %   MCV 84.0 78.0 - 100.0 fL   MCH 26.9 26.0 - 34.0 pg   MCHC 32.0 30.0 - 36.0 g/dL   RDW  16.5 (H) 11.5 - 15.5 %   Platelets 163 150 - 400 K/uL   Neutrophils Relative % 52 %   Neutro Abs 2.1 1.7 - 7.7 K/uL   Lymphocytes Relative 35 %   Lymphs Abs 1.5 0.7 - 4.0 K/uL   Monocytes Relative 11 %   Monocytes Absolute 0.5 0.1 - 1.0 K/uL   Eosinophils Relative 2 %   Eosinophils Absolute 0.1 0.0 - 0.7 K/uL   Basophils Relative 0 %   Basophils Absolute 0.0 0.0 - 0.1 K/uL   Immature Granulocytes 0 %   Abs Immature Granulocytes 0.0 0.0 - 0.1 K/uL  Rapid urine drug screen (hospital performed)  Result Value Ref Range   Opiates NONE DETECTED NONE DETECTED   Cocaine NONE DETECTED NONE DETECTED   Benzodiazepines POSITIVE (A) NONE DETECTED   Amphetamines NONE DETECTED NONE DETECTED   Tetrahydrocannabinol POSITIVE (A) NONE DETECTED   Barbiturates NONE DETECTED NONE DETECTED  I-Stat Troponin, ED (not at [facility])  Result Value Ref Range   Troponin i, poc 0.00 0.00 - 0.08 ng/mL   Comment 3          I-Stat Chem 8, ED  Result Value Ref Range   Sodium 138 135 - 145 mmol/L   Potassium 3.8 3.5 - 5.1 mmol/L   Chloride 106 98 - 111 mmol/L   BUN 18 8 - 23 mg/dL   Creatinine, Ser 0.80 0.44 - 1.00 mg/dL   Glucose, Bld 109 (H) 70 - 99 mg/dL   Calcium, Ion 1.08 (L) 1.15 - 1.40 mmol/L   TCO2 23 22 - 32 mmol/L   Hemoglobin 13.6 12.0 - 15.0 g/dL   HCT 40.0 36.0 - 46.0 %  I-stat troponin, ED  Result Value Ref Range   Troponin i, poc 0.00 0.00 - 0.08 ng/mL   Comment 3           Dg Chest Portable 1 View  Result Date: 10/04/2017 CLINICAL DATA:  Chest pain EXAM: PORTABLE CHEST 1 VIEW COMPARISON:  09/03/2017 FINDINGS: The heart size and mediastinal contours are within normal limits. Both lungs are clear. The visualized skeletal structures are unremarkable. IMPRESSION: No active disease. Electronically Signed   By:  Lucienne Capers M.D.   On: 10/04/2017 01:13    EKG EKG Interpretation  Date/Time:  Thursday October 04 2017 00:43:59 EDT Ventricular Rate:  49 PR Interval:    QRS Duration: 104 QT  Interval:  472 QTC Calculation: 427 R Axis:   11 Text Interpretation:  Sinus bradycardia Borderline repolarization abnormality Confirmed by Dory Horn) on 10/04/2017 1:07:29 AM   Radiology Dg Chest Portable 1 View  Result Date: 10/04/2017 CLINICAL DATA:  Chest pain EXAM: PORTABLE CHEST 1 VIEW COMPARISON:  09/03/2017 FINDINGS: The heart size and mediastinal contours are within normal limits. Both lungs are clear. The visualized skeletal structures are unremarkable. IMPRESSION: No active disease. Electronically Signed   By: Lucienne Capers M.D.   On: 10/04/2017 01:13    Procedures Procedures (including critical care time)  Medications Ordered in ED Medications  ketorolac (TORADOL) 15 MG/ML injection (has no administration in time range)  sucralfate (CARAFATE) 1 GM/10ML suspension 1 g (1 g Oral Not Given 10/04/17 0527)  albuterol (PROVENTIL) (2.5 MG/3ML) 0.083% nebulizer solution 5 mg (5 mg Nebulization Given 10/04/17 0104)  ipratropium (ATROVENT) nebulizer solution 0.5 mg (0.5 mg Nebulization Given 10/04/17 0104)  ondansetron (ZOFRAN) injection 4 mg (4 mg Intravenous Given 10/04/17 0137)  ketorolac (TORADOL) 15 MG/ML injection 15 mg (15 mg Intravenous Given 10/04/17 0223)  predniSONE (DELTASONE) tablet 60 mg (60 mg Oral Given 10/04/17 0305)  gi cocktail (Maalox,Lidocaine,Donnatal) (30 mLs Oral Given 10/04/17 0305)  acetaminophen (TYLENOL) tablet 1,000 mg (1,000 mg Oral Given 10/04/17 0455)     Final Clinical Impressions(s) / ED Diagnoses   Final diagnoses:  COPD exacerbation ([place])   I think that this is the patient's chronic pain.  The patient is having a COPD exaceration and we will treat for this and refer back to the patient's PMD for ongoing care.    Return for numbness, changes in vision or speech, fevers >100.4 unrelieved by medication, shortness of breath, intractable vomiting, or diarrhea, abdominal pain, Inability to tolerate liquids or food, cough, altered mental  status or any concerns. No signs of systemic illness or infection. The patient is nontoxic-appearing on exam and vital signs are within normal limits. Will refer to urology for microscopy hematuria as patient is asymptomatic.  I have reviewed the triage vital signs and the nursing notes. Pertinent labs &imaging results that were available during my care of the patient were reviewed by me and considered in my medical decision making (see chart for details).  After history, exam, and medical workup I feel the patient has been appropriately medically screened and is safe for discharge home. Pertinent diagnoses were discussed with the patient. Patient was given return precautions.      ED Discharge Orders         Ordered    predniSONE (DELTASONE) 20 MG tablet     10/04/17 0519    albuterol (PROVENTIL HFA;VENTOLIN HFA) 108 (90 Base) MCG/ACT inhaler  Every 6 hours PRN     10/04/17 0519    omeprazole (PRILOSEC) 20 MG capsule  Daily     10/04/17 0519           Anahlia Iseminger, MD 10/04/17 [phone]

## 2017-10-08 ENCOUNTER — Telehealth: Payer: Self-pay

## 2017-10-08 NOTE — Telephone Encounter (Signed)
Please Advise

## 2017-10-08 NOTE — Telephone Encounter (Signed)
Copied from [place] [phone]. Topic: General - Other >> Oct 08, 2017  8:34 AM Yvette Rack wrote: Reason for CRM: pt calling stating that she was in the hospital on the 15th she states that she has been urinating a little bit but go to the bathroom about 10 ties a day she takes that losartan-hydrochlorothiazide (HYZAAR) 100-25 MG tablet she also states that she would like a pain medicine for back and chest pain they gave her some at the hospital >> Oct 08, 2017  8:40 AM Yvette Rack wrote: Pt has an hospital f/u on Wednesday    Per chart pt was seen in ED 10/04/17. Pt was not given a rx for pain meds at that time. There is notation she follows with pain mngmt. There are no openings in [facility]' schedule before Wed.

## 2017-10-10 ENCOUNTER — Encounter: Payer: Self-pay | Admitting: Family Medicine

## 2017-10-10 ENCOUNTER — Ambulatory Visit (INDEPENDENT_AMBULATORY_CARE_PROVIDER_SITE_OTHER): Payer: Medicare Other | Admitting: Family Medicine

## 2017-10-10 VITALS — BP 136/82 | HR 54 | Temp 98.2°F | Wt 175.0 lb

## 2017-10-10 DIAGNOSIS — G8929 Other chronic pain: Secondary | ICD-10-CM

## 2017-10-10 DIAGNOSIS — R0789 Other chest pain: Secondary | ICD-10-CM | POA: Diagnosis not present

## 2017-10-10 NOTE — Progress Notes (Signed)
Subjective:    Patient ID: Jill Newton, female    DOB: April 03, 1955, 62 y.o.   MRN: [id number]  No chief complaint on file.   HPI Patient was seen today for f/u.  Pt was seen in ED for chronic substernal chest pain.  Pt was dx'd with COPD exacerbation after troponin, CXR, EXG, and labs were normal.  Pt notes daily pain.  She states she often lays in bed 2/2 the pain.  In the past pt was seen by Dr. Ricke Hey.  Pt trying to est with pain management but has been unsuccessful.  Pt has also been evaluated by GI for this discomfort.  Pt states carafate burns and prilosec does not seem to help. Has GI f/u in a few wks.  Past Medical History:  Diagnosis Date  . Anxiety   . Chest pain   . COPD (chronic obstructive pulmonary disease) (HCC)    Wheezing, SOB  . Depression   . Dyspnea    When going up inclines  . Edema    In ankles. Since going off meds  . Enlarged heart   . GERD (gastroesophageal reflux disease)   . Hyperlipidemia   . Hypertension   . Migraine   . Palpitations   . Peptic ulcer     Allergies  Allergen Reactions  . Codeine Nausea Only    ROS General: Denies fever, chills, night sweats, changes in weight, changes in appetite HEENT: Denies headaches, ear pain, changes in vision, rhinorrhea, sore throat CV: Denies CP, palpitations, SOB, orthopnea Pulm: Denies SOB, cough, wheezing GI: Denies abdominal pain, nausea, vomiting, diarrhea, constipation GU: Denies dysuria, hematuria, frequency, vaginal discharge Msk: Denies muscle cramps, joint pains  +substernal/subxyphoid chest pain. Neuro: Denies weakness, numbness, tingling Skin: Denies rashes, bruising Psych: Denies depression, anxiety, hallucinations     Objective:    Blood pressure 136/82, pulse (!) 54, temperature 98.2 F (36.8 C), temperature source Oral, weight 175 lb (79.4 kg), SpO2 96 %.   Gen. Pleasant, well-nourished, in no distress, normal affect   HEENT: [place]/AT, face symmetric, no  scleral icterus, PERRLA, nares patent without drainage Lungs: no accessory muscle use, CTAB, no wheezes or rales Cardiovascular: RRR, no m/r/g, no peripheral edema Neuro:  A&Ox3, CN II-XII intact, normal gait   Wt Readings from Last 3 Encounters:  10/10/17 175 lb (79.4 kg)  08/09/17 177 lb (80.3 kg)  05/21/17 186 lb (84.4 kg)    Lab Results  Component Value Date   WBC 4.2 10/04/2017   HGB 13.6 10/04/2017   HCT 40.0 10/04/2017   PLT 163 10/04/2017   GLUCOSE 109 (H) 10/04/2017   CHOL (H) 06/02/2010    236        ATP III CLASSIFICATION:  <200     mg/dL   Desirable  200-239  mg/dL   Borderline High  >=240    mg/dL   High          TRIG 75 06/02/2010   HDL 72 06/02/2010   LDLCALC (H) 06/02/2010    149        Total Cholesterol/HDL:CHD Risk Coronary Heart Disease Risk Table                     Men   Women  1/2 Average Risk   3.4   3.3  Average Risk       5.0   4.4  2 X Average Risk   9.6   7.1  3 X Average Risk  23.4   11.0        Use the calculated Patient Ratio above and the CHD Risk Table to determine the patient's CHD Risk.        ATP III CLASSIFICATION (LDL):  <100     mg/dL   Optimal  100-129  mg/dL   Near or Above                    Optimal  130-159  mg/dL   Borderline  160-189  mg/dL   High  >190     mg/dL   Very High   ALT 14 07/25/2016   AST 14 07/25/2016   NA 138 10/04/2017   K 3.8 10/04/2017   CL 106 10/04/2017   CREATININE 0.80 10/04/2017   BUN 18 10/04/2017   CO2 27 09/03/2017   INR 0.97 06/02/2010    Assessment/Plan:  Other chronic pain  -pt previously on opioid pain meds prior to establishing with this provider. -discussed pt may have exhausted pain management options in the [place] area -discussed alternatives to pain meds including yoga, accupuncture, etc. -pt encouraged to keep f/u with GI  F/u prn  Grier Mitts, MD

## 2017-10-11 ENCOUNTER — Encounter: Payer: Self-pay | Admitting: Family Medicine

## 2017-10-11 NOTE — Telephone Encounter (Signed)
Pt was seen in office this wk.

## 2017-10-19 ENCOUNTER — Other Ambulatory Visit (INDEPENDENT_AMBULATORY_CARE_PROVIDER_SITE_OTHER): Payer: Medicare Other

## 2017-10-19 ENCOUNTER — Encounter: Payer: Self-pay | Admitting: Gastroenterology

## 2017-10-19 ENCOUNTER — Other Ambulatory Visit: Payer: Self-pay | Admitting: Gastroenterology

## 2017-10-19 ENCOUNTER — Ambulatory Visit (INDEPENDENT_AMBULATORY_CARE_PROVIDER_SITE_OTHER): Payer: Medicare Other | Admitting: Gastroenterology

## 2017-10-19 VITALS — BP 140/78 | HR 60 | Ht 63.5 in | Wt 175.1 lb

## 2017-10-19 DIAGNOSIS — R0789 Other chest pain: Secondary | ICD-10-CM

## 2017-10-19 DIAGNOSIS — K224 Dyskinesia of esophagus: Secondary | ICD-10-CM | POA: Diagnosis not present

## 2017-10-19 LAB — BUN: BUN: 18 mg/dL (ref 6–23)

## 2017-10-19 LAB — CREATININE, SERUM: Creatinine, Ser: 1.01 mg/dL (ref 0.40–1.20)

## 2017-10-19 MED ORDER — DILTIAZEM HCL 60 MG PO TABS: 60.0000 mg | ORAL_TABLET | Freq: Two times a day (BID) | ORAL | 1 refills | Status: DC
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
## 2017-10-19 NOTE — Patient Instructions (Addendum)
If you are age 63 or older, your body mass index should be between 23-30. Your Body mass index is 30.54 kg/m. If this is out of the aforementioned range listed, please consider follow up with your Primary Care Provider.  If you are age 42 or younger, your body mass index should be between 19-25. Your Body mass index is 30.54 kg/m. If this is out of the aformentioned range listed, please consider follow up with your Primary Care Provider.   Please go to the lab in the basement of our building to have lab work done as you leave today.   You have been scheduled for a CT scan of the chest at [facility] ([address]---this is in the same building as [facility]).   You are scheduled on Thursday, 11-01-17 at 1:00pm. You should arrive 15 minutes prior to your appointment time for registration. Please follow the written instructions below on the day of your exam: Do not have any solid foods after 11:00am prior to the procedure.  If you have any questions regarding your exam or if you need to reschedule, you may call the CT department at [phone] between the hours of 8:00 am and 5:00 pm, Monday-Friday.  ______________________________________________________________________  We have sent the following medications to your pharmacy for you to pick up at your convenience: Diltiazem 60 mg: Take twice a day    Thank you for entrusting me with your care and for choosing [employer], Dr. [place] Cellar

## 2017-10-23 ENCOUNTER — Telehealth: Payer: Self-pay | Admitting: Gastroenterology

## 2017-10-23 NOTE — Telephone Encounter (Signed)
Okay, sorry to hear this. She has only been on it a few days. That being said, based on prior evaluation this may more likely be neuropathic / musculoskeletal. Why don't we try Gabapentin 300mg  q HS to start for one week, then increase to twice daily dosing. We can give her a 1 month supply to start, with refill. Thanks.

## 2017-10-23 NOTE — Telephone Encounter (Signed)
Routed to Dr. Havery Moros, patient seen Friday 10/19/17.

## 2017-10-24 ENCOUNTER — Other Ambulatory Visit: Payer: Self-pay

## 2017-10-24 MED ORDER — GABAPENTIN 300 MG PO CAPS: ORAL_CAPSULE | ORAL | 1 refills | Status: DC

## 2017-10-24 NOTE — Telephone Encounter (Signed)
Patient advised of results and recommendations. She will try the Gabapentin.

## 2017-11-01 ENCOUNTER — Ambulatory Visit (INDEPENDENT_AMBULATORY_CARE_PROVIDER_SITE_OTHER)
Admission: RE | Admit: 2017-11-01 | Discharge: 2017-11-01 | Disposition: A | Discharge status: Home / Self Care | Payer: Medicare Other | Source: Ambulatory Visit | Attending: Gastroenterology | Admitting: Gastroenterology

## 2017-11-01 DIAGNOSIS — R0789 Other chest pain: Secondary | ICD-10-CM

## 2017-11-01 MED ORDER — IOPAMIDOL (ISOVUE-300) INJECTION 61%
80.0000 mL | Freq: Once | INTRAVENOUS | Status: AC | PRN
  Administered 2017-11-01: 80 mL via INTRAVENOUS

## 2017-11-05 ENCOUNTER — Telehealth: Payer: Self-pay | Admitting: Gastroenterology

## 2017-11-05 ENCOUNTER — Other Ambulatory Visit: Payer: Self-pay | Admitting: Family Medicine

## 2017-11-05 MED ORDER — ATORVASTATIN CALCIUM 10 MG PO TABS: 10.0000 mg | ORAL_TABLET | Freq: Every day | ORAL | 3 refills | Status: DC

## 2017-11-05 NOTE — Telephone Encounter (Signed)
Pt wants to refill her atorvastatin. Pt stated that she has not had her cholesterol for a year or more and wanted to get refill. Advised ot she would need appointment to be seen. Pt stated she had been on the atorvastatin before but it has been several years.  Pt stated she felt like her BP was beginning to run low and discussed her BP medications. She stated she went to a local drug store and checked her BP several times and her systolic BP was in the 46'X.   Appointment made 11/08/17 at 0830 to discuss her BP, meds and discuss lipids (atorvastatin)  atorvastatin refill Last Refill:start date 06/30/17 3 RF (historical provider) Last OV: none noted that addresses this issue PCP: Dr Volanda Napoleon  [facility]armacy:[facility] [phone] [place]

## 2017-11-05 NOTE — Telephone Encounter (Signed)
Let patient know that Dr. Havery Moros has not had a chance to review that results and will be back in the office tomorrow.

## 2017-11-05 NOTE — Telephone Encounter (Signed)
Copied from [place] [phone]. Topic: Quick Communication - See Telephone Encounter >> Nov 05, 2017  9:07 AM Conception Chancy, NT wrote: CRM for notification. See Telephone encounter for: 11/05/17.  Patient is requesting a refill on atorvastatin (LIPITOR) 10 MG tablet.  [facility] [phone] - [place], [address] AT [place] [address] Phone: [phone] Fax: [phone]

## 2017-11-08 ENCOUNTER — Ambulatory Visit: Payer: Medicare Other | Admitting: Family Medicine

## 2017-11-08 DIAGNOSIS — Z0289 Encounter for other administrative examinations: Secondary | ICD-10-CM

## 2017-11-14 ENCOUNTER — Telehealth: Payer: Self-pay | Admitting: Family Medicine

## 2017-11-14 DIAGNOSIS — R079 Chest pain, unspecified: Secondary | ICD-10-CM

## 2017-11-14 NOTE — Telephone Encounter (Signed)
Ok to refer to pain management in [place].

## 2017-11-14 NOTE — Telephone Encounter (Signed)
Copied from [place] [phone]. Topic: Referral - Request >> Nov 14, 2017 12:00 PM Ivar Drape wrote: Reason for CRM:   Patient would like a referral to see a pain management doctor in [facility] This is for the chest pains she is having.  She stated the provider knows about the pain she is experiencing.

## 2017-11-14 NOTE — Telephone Encounter (Signed)
Okay to refer? 

## 2017-11-15 NOTE — Addendum Note (Signed)
Addended by: Agnes Lawrence on: 11/15/2017 05:15 PM   Modules accepted: Orders

## 2017-11-15 NOTE — Telephone Encounter (Signed)
I called the pt and informed her of the message below.  Per the pts requests the referral was placed for [facility][phone] as she stated her friend referred her and she does not have a particular physicians name.

## 2017-11-15 NOTE — Addendum Note (Signed)
Addended by: Agnes Lawrence on: 11/15/2017 05:10 PM   Modules accepted: Orders

## 2017-11-19 ENCOUNTER — Telehealth: Payer: Self-pay | Admitting: Family Medicine

## 2017-11-19 NOTE — Telephone Encounter (Signed)
Copied from [place] [id number]. Topic: General - Other >> Nov 19, 2017  9:50 AM Yvette Rack wrote: Reason for CRM: [employer] states the referral request should be sent to fax # [phone]

## 2017-11-19 NOTE — Telephone Encounter (Signed)
Patient is calling to advise that Dr.banks is needing to call her insurance to verify  That she cover, the patient stated she just changed her plan. So she can have a referral to a pain clinic. The contact number  [phone].

## 2017-11-20 NOTE — Telephone Encounter (Signed)
I'm confused.  Why do I need to call the pt's insurance to see if pain management is covered.  Sounds like something the pt should do, unless I'm not understanding what is needed from me.

## 2017-11-20 NOTE — Telephone Encounter (Signed)
Please advise if ok to place referral  

## 2017-11-23 NOTE — Telephone Encounter (Signed)
Spoke with pt stated that she is waiting for her new insurance card from [place] [employer] so she can go to the pain management clinic.

## 2017-11-23 NOTE — Telephone Encounter (Signed)
Pt states that she will bring the new insurance card to the office for scanning.

## 2017-12-10 ENCOUNTER — Telehealth: Payer: Self-pay | Admitting: Family Medicine

## 2017-12-10 NOTE — Telephone Encounter (Signed)
Copied from [place] [phone]. Topic: Referral - Request for Referral >> Dec 04, 2017  1:22 PM Wynetta Emery, [place] C wrote: Has patient seen PCP for this complaint? Yes   *If NO, is insurance requiring patient see PCP for this issue before PCP can refer them?  Referral for which specialty: Dr. Dorene Ar - Pain Management -phone: [phone] Fax: [phone]  Preferred provider/office: Nadeen Landau)   Reason for referral:  Pt calling back to check status of referral

## 2017-12-13 ENCOUNTER — Other Ambulatory Visit: Payer: Self-pay | Admitting: Family Medicine

## 2017-12-13 DIAGNOSIS — G8929 Other chronic pain: Secondary | ICD-10-CM

## 2017-12-13 NOTE — Telephone Encounter (Signed)
Referral to pain clinic has been placed per pt request, pt aware

## 2017-12-14 NOTE — Telephone Encounter (Signed)
Pt states the pain clinic states they did not receive the referral. Fax#: [phone]

## 2017-12-18 NOTE — Telephone Encounter (Signed)
Referral was fax t for the 3rd time to Dr. Rayvon Char. Francesco Runner, MD-was told to fax  Notes referral  Pain management physician in [place], [place] Address: [address], [place], [place] [address] Phone: [phone] Fax [phone] office confirmed it was received called the office

## 2017-12-24 ENCOUNTER — Telehealth: Payer: Self-pay | Admitting: Gastroenterology

## 2017-12-24 NOTE — Telephone Encounter (Signed)
Pt called to inform that she found a pain management doctor that is in network with her insurance as Dr. Havery Moros wanted her to see one. His name is Dr. Gae Bon. His office needs referral and ov notes so pt can schedule an appt. Pls fax referral at [phone].

## 2017-12-24 NOTE — Telephone Encounter (Signed)
Dr. Havery Moros is it ok to proceed with referral to pain clinic per note below from pt? Please advise.

## 2017-12-24 NOTE — Telephone Encounter (Signed)
Pt called back and states that Dr Isabelle Course office needs any office visit notes faxed to them and anything that Dr  Volanda Napoleon may have from Dr Doyne Keel office as well.

## 2017-12-25 NOTE — Telephone Encounter (Signed)
Referral faxed

## 2017-12-25 NOTE — Telephone Encounter (Signed)
Thanks, yes that would good to proceed with referral to pain management. Can you send them my clinic note from 8/30 as well as a copy of the chest CT which was done. Thanks

## 2017-12-27 ENCOUNTER — Other Ambulatory Visit: Payer: Self-pay | Admitting: Family Medicine

## 2017-12-27 NOTE — Telephone Encounter (Signed)
The office notes needed was sent to Dr Ellouise Newer office with the referral by our referral co-ordinator, pt is aware that referral is still on review and she will be contacted when they make determination of her need.

## 2017-12-29 ENCOUNTER — Other Ambulatory Visit: Payer: Self-pay | Admitting: Family Medicine

## 2018-01-01 ENCOUNTER — Telehealth: Payer: Self-pay | Admitting: Family Medicine

## 2018-01-01 NOTE — Telephone Encounter (Signed)
Copied from [place] [phone]. Topic: Quick Communication - See Telephone Encounter >> Jan 01, 2018 10:53 AM Antonieta Iba C wrote: CRM for notification. See Telephone encounter for: 01/01/18.  Pharmacy called in to make provider aware that pt's Rx for losartan-hydrochlorothiazide (HYZAAR) 100-25 MG tablet, pharmacy says that medication is currently on back order. They would like to know if it is okay to either provide pt with the 50-12.5 strength and pt double up OR if provider would like for them to provide 2 different Rx's?   Please advise.   CB: [phone]

## 2018-01-02 NOTE — Telephone Encounter (Signed)
Either is ok with me.  Ask pt if she wants to take the double dose or have the rx split.

## 2018-01-02 NOTE — Telephone Encounter (Signed)
Patient states that it is ok with her to get the medication as long as it don't cost extra money.

## 2018-01-02 NOTE — Telephone Encounter (Signed)
Pharmacy states that they have losartan-hydrochlorothiazide (HYZAAR) 50-12.5 MG tablet requesting a new Rx for this medication. Per patient its ok

## 2018-01-03 NOTE — Telephone Encounter (Signed)
Spoke wit pt pharmacy regarding her  Losartan- Hydrochlorothiazide Rx and voiced understanding that pt is ok with the Rx as long as this does not cost her more. Pharmacy is aware to refill the Rx.

## 2018-01-31 ENCOUNTER — Telehealth: Payer: Self-pay

## 2018-02-01 ENCOUNTER — Telehealth: Payer: Self-pay

## 2018-02-01 NOTE — Telephone Encounter (Signed)
Copied from [place] [phone]. Topic: Referral - Request for Referral >> Jan 16, 2018  2:39 PM Percell Belt A wrote: Has patient seen PCP for this complaint? Yes  *If NO, is insurance requiring patient see PCP for this issue before PCP can refer them?  Referral for which specialty:  Preferred provider/office: [facility] [place], [address], [place] [address] Fax number [phone] referral to)  [phone] Reason for referral: Pt was referred to ortho and she did not want to go to ortho.  She also wanted to have a MRI of her strum.  She stated she has spoke with provider about this.

## 2018-02-05 ENCOUNTER — Telehealth: Payer: Self-pay | Admitting: Family Medicine

## 2018-02-05 ENCOUNTER — Other Ambulatory Visit: Payer: Self-pay | Admitting: Family Medicine

## 2018-02-05 MED ORDER — GABAPENTIN 300 MG PO CAPS: 300.0000 mg | ORAL_CAPSULE | Freq: Two times a day (BID) | ORAL | 1 refills | Status: DC

## 2018-02-05 NOTE — Telephone Encounter (Signed)
Refill sent in

## 2018-02-05 NOTE — Telephone Encounter (Signed)
Ok to send Rx 

## 2018-02-05 NOTE — Telephone Encounter (Signed)
Copied from [place] [id number]. Topic: Quick Communication - See Telephone Encounter >> Feb 05, 2018  9:31 AM Ruther[place] Nail, NT wrote: CRM for notification. See Telephone encounter for: 02/05/18. Patient calling and is requesting for Dr Volanda Napoleon to prescribe her gabapentin until she is able to get into the pain clinic in January. Please advise. [id number] [address] [place], [address]

## 2018-02-26 ENCOUNTER — Telehealth: Payer: Self-pay

## 2018-02-26 NOTE — Telephone Encounter (Signed)
Copied from [place] [phone]. Topic: Referral - Request for Referral >> Feb 26, 2018  1:25 PM Yvette Rack wrote: Has patient seen PCP for this complaint? Yes.   *If NO, is insurance requiring patient see PCP for this issue before PCP can refer them? Referral for which specialty: pain management in [place] only  (not in [place]) Preferred provider/office: pt doesn't know name of office but has fax and number to office (P) [phone] (F) [phone] Reason for referral: rt side of chest pain/sternum

## 2018-03-11 ENCOUNTER — Other Ambulatory Visit: Payer: Self-pay | Admitting: Family Medicine

## 2018-03-11 DIAGNOSIS — R079 Chest pain, unspecified: Secondary | ICD-10-CM

## 2018-03-11 NOTE — Telephone Encounter (Signed)
Referral has been placed, spoke with pt and is aware

## 2018-03-13 NOTE — Telephone Encounter (Signed)
Patient called stated that she called the [facility] and they say they did not receive the referral that was sent. Please resend

## 2018-03-14 NOTE — Telephone Encounter (Signed)
Pt states some confusion with [facility] is the doctor works out of [employer] and [employer]. Please send to either or both and she'll schedule at [facility] office.

## 2018-03-15 ENCOUNTER — Telehealth: Payer: Self-pay

## 2018-03-15 NOTE — Telephone Encounter (Signed)
Pt referral request has already been placed

## 2018-03-19 NOTE — Telephone Encounter (Signed)
Spoke with Inez Catalina at [facility] Needing CT chest results 11/01/17  faxed over In order to finalize the referral request.  (fax) [phone] Would like a call back as soon as this is faxed (phone) [phone] Opt #5  Will send to Neoma Laming to fax over the rest of the documents needed.

## 2018-03-21 NOTE — Telephone Encounter (Signed)
Spoke with Inez Catalina at [facility] to informed that I faxed over   CT chest results 11/01/17  (fax) [phone]

## 2018-03-22 NOTE — Telephone Encounter (Signed)
noted 

## 2018-04-03 ENCOUNTER — Other Ambulatory Visit: Payer: Self-pay | Admitting: Family Medicine

## 2018-04-30 ENCOUNTER — Other Ambulatory Visit: Payer: Self-pay | Admitting: Family Medicine

## 2018-04-30 MED ORDER — LOSARTAN POTASSIUM-HCTZ 100-25 MG PO TABS: 1.0000 | ORAL_TABLET | Freq: Every day | ORAL | 0 refills | Status: AC

## 2018-04-30 NOTE — Telephone Encounter (Signed)
Unable to refill per protocol. Pt requesting a 90 day supply of medication and doses not want to come in for an appt due to it being flu season.

## 2018-04-30 NOTE — Telephone Encounter (Signed)
Pt needs an appointment for further refills  

## 2018-04-30 NOTE — Telephone Encounter (Signed)
Copied from [place] [phone]. Topic: Quick Communication - Rx Refill/Question >> Apr 30, 2018 11:52 AM Margot Ables wrote: Medication: losartan-hydrochlorothiazide (HYZAAR) 100-25 MG tablet - pharmacy told pt they have been contacting MD for 2 weeks and no response on filling medication - pt has 1 pill left - not wanting appt now due to being flu season  Pt prefers 90 day supply  Last OV 08/09/2017 Next visit - none scheduled Last filled 12/31/2017 #90 no refills  Has the patient contacted their pharmacy? yes Preferred Pharmacy (with phone number or street name): [facility] [phone] - [address], [address] AT [address] [phone] (Phone) [phone] (Fax)

## 2018-05-01 NOTE — Telephone Encounter (Signed)
Pharmacy stated that losartan-hydrochlorothiazide (HYZAAR) 100-25 MG tablet  Is on nationwide back order. Asking for authorization to split rx in order to fill. Please advise.   [facility] [phone] - [place], [address] AT [address]          [phone] (Phone) [phone] (Fax)

## 2018-05-02 ENCOUNTER — Other Ambulatory Visit: Payer: Self-pay

## 2018-05-02 MED ORDER — LOSARTAN POTASSIUM 100 MG PO TABS: 100.0000 mg | ORAL_TABLET | Freq: Every day | ORAL | 0 refills | Status: DC

## 2018-05-02 MED ORDER — HYDROCHLOROTHIAZIDE 25 MG PO TABS: 25.0000 mg | ORAL_TABLET | Freq: Every day | ORAL | 3 refills | Status: DC

## 2018-05-02 NOTE — Telephone Encounter (Signed)
Patient calling to find out if she needs an appointment to get the refill or not? Advised per Arbie Cookey that clinical member Shan Levans will call patient and notify her whether it can be called in without an appointment. If this will be called it the pharmacy is requesting the script be split since losartan-hydrochlorothiazide (HYZAAR) 100-25 MG tablet is on a Museum[employer]gallery conserv[employer].

## 2018-05-02 NOTE — Telephone Encounter (Signed)
I spoke with Dr. Volanda Napoleon and she stated that it is okay for the pharmacy to split the Rx.   The patient will have to make an appointment for future refills.  Please advise patient

## 2018-05-02 NOTE — Telephone Encounter (Signed)
Patient states the pharmacy keeps telling her that they do not have this script. I personally called the pharmacy myself - and they did pick up the phone. Waited 8 mins. Can this be sent again?

## 2018-05-02 NOTE — Telephone Encounter (Signed)
Rx split and sent to pt pharmacy until pharmacy receives the Hyzaar which is on back order.

## 2018-05-26 ENCOUNTER — Other Ambulatory Visit: Payer: Self-pay | Admitting: Family Medicine

## 2018-05-27 DIAGNOSIS — Z008 Encounter for other general examination: Secondary | ICD-10-CM | POA: Diagnosis not present

## 2018-05-27 DIAGNOSIS — R918 Other nonspecific abnormal finding of lung field: Secondary | ICD-10-CM | POA: Diagnosis not present

## 2018-05-27 DIAGNOSIS — J449 Chronic obstructive pulmonary disease, unspecified: Secondary | ICD-10-CM | POA: Diagnosis not present

## 2018-06-03 NOTE — Telephone Encounter (Signed)
Ok to send refill  

## 2018-06-10 DIAGNOSIS — J449 Chronic obstructive pulmonary disease, unspecified: Secondary | ICD-10-CM | POA: Diagnosis not present

## 2018-06-10 DIAGNOSIS — G8929 Other chronic pain: Secondary | ICD-10-CM | POA: Diagnosis not present

## 2018-06-10 DIAGNOSIS — Z008 Encounter for other general examination: Secondary | ICD-10-CM | POA: Diagnosis not present

## 2018-07-15 ENCOUNTER — Encounter: Payer: Self-pay | Admitting: Gastroenterology

## 2018-07-22 DIAGNOSIS — I10 Essential (primary) hypertension: Secondary | ICD-10-CM | POA: Diagnosis not present

## 2018-07-22 DIAGNOSIS — G8929 Other chronic pain: Secondary | ICD-10-CM | POA: Diagnosis not present

## 2018-07-22 DIAGNOSIS — J432 Centrilobular emphysema: Secondary | ICD-10-CM | POA: Diagnosis not present

## 2018-09-24 ENCOUNTER — Other Ambulatory Visit: Payer: Self-pay | Admitting: Family

## 2018-09-24 DIAGNOSIS — Z1231 Encounter for screening mammogram for malignant neoplasm of breast: Secondary | ICD-10-CM

## 2018-09-24 DIAGNOSIS — R911 Solitary pulmonary nodule: Secondary | ICD-10-CM

## 2018-10-14 ENCOUNTER — Other Ambulatory Visit: Payer: Self-pay | Admitting: Family Medicine

## 2018-10-14 NOTE — Telephone Encounter (Signed)
Spoke with pt states that she has a new PCP and no longer seeing Dr Volanda Napoleon due to her change of insurance.

## 2018-11-20 ENCOUNTER — Ambulatory Visit: Payer: Medicare Other

## 2019-04-25 NOTE — Telephone Encounter (Signed)
This encounter is complete

## 2019-05-03 ENCOUNTER — Inpatient Hospital Stay (HOSPITAL_COMMUNITY): Payer: Medicare Other

## 2019-05-03 ENCOUNTER — Inpatient Hospital Stay (HOSPITAL_COMMUNITY)
Admission: EM | Admit: 2019-05-03 | Discharge: 2019-05-05 | DRG: 305 | Disposition: A | Discharge status: Home / Self Care | Payer: Medicare Other | Attending: Internal Medicine | Admitting: Internal Medicine

## 2019-05-03 ENCOUNTER — Encounter (HOSPITAL_COMMUNITY): Payer: Self-pay | Admitting: Radiology

## 2019-05-03 ENCOUNTER — Emergency Department (HOSPITAL_COMMUNITY): Payer: Medicare Other

## 2019-05-03 ENCOUNTER — Other Ambulatory Visit: Payer: Self-pay

## 2019-05-03 DIAGNOSIS — R0789 Other chest pain: Secondary | ICD-10-CM | POA: Diagnosis present

## 2019-05-03 DIAGNOSIS — I119 Hypertensive heart disease without heart failure: Secondary | ICD-10-CM | POA: Diagnosis present

## 2019-05-03 DIAGNOSIS — R55 Syncope and collapse: Secondary | ICD-10-CM | POA: Diagnosis present

## 2019-05-03 DIAGNOSIS — Z8711 Personal history of peptic ulcer disease: Secondary | ICD-10-CM | POA: Diagnosis not present

## 2019-05-03 DIAGNOSIS — Z823 Family history of stroke: Secondary | ICD-10-CM

## 2019-05-03 DIAGNOSIS — R001 Bradycardia, unspecified: Secondary | ICD-10-CM | POA: Diagnosis present

## 2019-05-03 DIAGNOSIS — I351 Nonrheumatic aortic (valve) insufficiency: Secondary | ICD-10-CM | POA: Diagnosis not present

## 2019-05-03 DIAGNOSIS — Z20822 Contact with and (suspected) exposure to covid-19: Secondary | ICD-10-CM | POA: Diagnosis present

## 2019-05-03 DIAGNOSIS — J439 Emphysema, unspecified: Secondary | ICD-10-CM | POA: Diagnosis present

## 2019-05-03 DIAGNOSIS — Z808 Family history of malignant neoplasm of other organs or systems: Secondary | ICD-10-CM | POA: Diagnosis not present

## 2019-05-03 DIAGNOSIS — Z79899 Other long term (current) drug therapy: Secondary | ICD-10-CM

## 2019-05-03 DIAGNOSIS — R42 Dizziness and giddiness: Secondary | ICD-10-CM | POA: Diagnosis not present

## 2019-05-03 DIAGNOSIS — F329 Major depressive disorder, single episode, unspecified: Secondary | ICD-10-CM | POA: Diagnosis present

## 2019-05-03 DIAGNOSIS — Z833 Family history of diabetes mellitus: Secondary | ICD-10-CM | POA: Diagnosis not present

## 2019-05-03 DIAGNOSIS — R079 Chest pain, unspecified: Secondary | ICD-10-CM

## 2019-05-03 DIAGNOSIS — I161 Hypertensive emergency: Principal | ICD-10-CM | POA: Diagnosis present

## 2019-05-03 DIAGNOSIS — N179 Acute kidney failure, unspecified: Secondary | ICD-10-CM | POA: Diagnosis not present

## 2019-05-03 DIAGNOSIS — Z7151 Drug abuse counseling and surveillance of drug abuser: Secondary | ICD-10-CM | POA: Diagnosis not present

## 2019-05-03 DIAGNOSIS — F17210 Nicotine dependence, cigarettes, uncomplicated: Secondary | ICD-10-CM | POA: Diagnosis present

## 2019-05-03 DIAGNOSIS — Z8249 Family history of ischemic heart disease and other diseases of the circulatory system: Secondary | ICD-10-CM | POA: Diagnosis not present

## 2019-05-03 DIAGNOSIS — K219 Gastro-esophageal reflux disease without esophagitis: Secondary | ICD-10-CM | POA: Diagnosis present

## 2019-05-03 DIAGNOSIS — F419 Anxiety disorder, unspecified: Secondary | ICD-10-CM | POA: Diagnosis present

## 2019-05-03 DIAGNOSIS — R61 Generalized hyperhidrosis: Secondary | ICD-10-CM | POA: Diagnosis present

## 2019-05-03 DIAGNOSIS — G43909 Migraine, unspecified, not intractable, without status migrainosus: Secondary | ICD-10-CM | POA: Diagnosis present

## 2019-05-03 DIAGNOSIS — E785 Hyperlipidemia, unspecified: Secondary | ICD-10-CM | POA: Diagnosis present

## 2019-05-03 DIAGNOSIS — F1220 Cannabis dependence, uncomplicated: Secondary | ICD-10-CM | POA: Diagnosis present

## 2019-05-03 LAB — URINALYSIS, ROUTINE W REFLEX MICROSCOPIC
Bilirubin Urine: NEGATIVE
Bilirubin Urine: NEGATIVE
Glucose, UA: NEGATIVE mg/dL
Glucose, UA: NEGATIVE mg/dL
Hgb urine dipstick: NEGATIVE
Ketones, ur: 20 mg/dL — AB
Ketones, ur: 5 mg/dL — AB
Leukocytes,Ua: NEGATIVE
Leukocytes,Ua: NEGATIVE
Nitrite: NEGATIVE
Nitrite: NEGATIVE
Protein, ur: 30 mg/dL — AB
Protein, ur: NEGATIVE mg/dL
Specific Gravity, Urine: 1.018 (ref 1.005–1.030)
Specific Gravity, Urine: 1.034 — ABNORMAL HIGH (ref 1.005–1.030)
pH: 6 (ref 5.0–8.0)
pH: 6 (ref 5.0–8.0)

## 2019-05-03 LAB — COMPREHENSIVE METABOLIC PANEL
ALT: 30 U/L (ref 0–44)
AST: 21 U/L (ref 15–41)
Albumin: 4 g/dL (ref 3.5–5.0)
Alkaline Phosphatase: 75 U/L (ref 38–126)
Anion gap: 14 (ref 5–15)
BUN: 12 mg/dL (ref 8–23)
CO2: 22 mmol/L (ref 22–32)
Calcium: 9.2 mg/dL (ref 8.9–10.3)
Chloride: 99 mmol/L (ref 98–111)
Creatinine, Ser: 1.04 mg/dL — ABNORMAL HIGH (ref 0.44–1.00)
GFR calc Af Amer: >60 mL/min (ref >60)
GFR calc non Af Amer: 57 mL/min — ABNORMAL LOW (ref >60)
Glucose, Bld: 158 mg/dL — ABNORMAL HIGH (ref 70–99)
Potassium: 3.5 mmol/L (ref 3.5–5.1)
Sodium: 135 mmol/L (ref 135–145)
Total Bilirubin: 1 mg/dL (ref 0.3–1.2)
Total Protein: 7.4 g/dL (ref 6.5–8.1)

## 2019-05-03 LAB — CBC WITH DIFFERENTIAL/PLATELET
Abs Immature Granulocytes: 0.01 10*3/uL (ref 0.00–0.07)
Basophils Absolute: 0 10*3/uL (ref 0.0–0.1)
Basophils Relative: 0 %
Eosinophils Absolute: 0 10*3/uL (ref 0.0–0.5)
Eosinophils Relative: 0 %
HCT: 44.6 % (ref 36.0–46.0)
Hemoglobin: 13.9 g/dL (ref 12.0–15.0)
Immature Granulocytes: 0 %
Lymphocytes Relative: 16 %
Lymphs Abs: 0.6 10*3/uL — ABNORMAL LOW (ref 0.7–4.0)
MCH: 26.1 pg (ref 26.0–34.0)
MCHC: 31.2 g/dL (ref 30.0–36.0)
MCV: 83.8 fL (ref 80.0–100.0)
Monocytes Absolute: 0.2 10*3/uL (ref 0.1–1.0)
Monocytes Relative: 4 %
Neutro Abs: 3.1 10*3/uL (ref 1.7–7.7)
Neutrophils Relative %: 80 %
Platelets: 158 10*3/uL (ref 150–400)
RBC: 5.32 MIL/uL — ABNORMAL HIGH (ref 3.87–5.11)
RDW: 17.2 % — ABNORMAL HIGH (ref 11.5–15.5)
WBC: 3.9 10*3/uL — ABNORMAL LOW (ref 4.0–10.5)
nRBC: 0 % (ref 0.0–0.2)

## 2019-05-03 LAB — RAPID URINE DRUG SCREEN, HOSP PERFORMED
Amphetamines: NOT DETECTED
Barbiturates: NOT DETECTED
Benzodiazepines: POSITIVE — AB
Cocaine: NOT DETECTED
Opiates: POSITIVE — AB
Tetrahydrocannabinol: POSITIVE — AB

## 2019-05-03 LAB — LIPASE, BLOOD: Lipase: 15 U/L (ref 11–51)

## 2019-05-03 LAB — POC SARS CORONAVIRUS 2 AG -  ED: SARS Coronavirus 2 Ag: NEGATIVE

## 2019-05-03 LAB — TROPONIN I (HIGH SENSITIVITY)
Troponin I (High Sensitivity): 2 ng/L (ref <18)
Troponin I (High Sensitivity): 6 ng/L (ref <18)

## 2019-05-03 LAB — LACTIC ACID, PLASMA: Lactic Acid, Venous: 1.7 mmol/L (ref 0.5–1.9)

## 2019-05-03 LAB — BRAIN NATRIURETIC PEPTIDE: B Natriuretic Peptide: 67.4 pg/mL (ref 0.0–100.0)

## 2019-05-03 LAB — SARS CORONAVIRUS 2 (TAT 6-24 HRS): SARS Coronavirus 2: NEGATIVE

## 2019-05-03 LAB — MAGNESIUM: Magnesium: 1.9 mg/dL (ref 1.7–2.4)

## 2019-05-03 LAB — HIV ANTIBODY (ROUTINE TESTING W REFLEX): HIV Screen 4th Generation wRfx: NONREACTIVE

## 2019-05-03 MED ORDER — MAGNESIUM SULFATE 2 GM/50ML IV SOLN
2.0000 g | Freq: Once | INTRAVENOUS | Status: AC
  Administered 2019-05-03: 2 g via INTRAVENOUS
  Filled 2019-05-03: qty 50

## 2019-05-03 MED ORDER — LOSARTAN POTASSIUM 50 MG PO TABS
100.0000 mg | ORAL_TABLET | Freq: Every day | ORAL | Status: DC
  Administered 2019-05-03 – 2019-05-05 (×3): 100 mg via ORAL
  Filled 2019-05-03 (×3): qty 2

## 2019-05-03 MED ORDER — DEXTROSE IN LACTATED RINGERS 5 % IV SOLN: INTRAVENOUS | Status: DC

## 2019-05-03 MED ORDER — HYDROCHLOROTHIAZIDE 25 MG PO TABS
25.0000 mg | ORAL_TABLET | Freq: Every day | ORAL | Status: DC
  Administered 2019-05-03 – 2019-05-05 (×3): 25 mg via ORAL
  Filled 2019-05-03 (×3): qty 1

## 2019-05-03 MED ORDER — LORAZEPAM 2 MG/ML IJ SOLN
2.0000 mg | Freq: Once | INTRAMUSCULAR | Status: AC
  Administered 2019-05-03: 2 mg via INTRAVENOUS
  Filled 2019-05-03: qty 1

## 2019-05-03 MED ORDER — NICARDIPINE HCL IN NACL 20-0.86 MG/200ML-% IV SOLN
3.0000 mg/h | INTRAVENOUS | Status: DC
  Administered 2019-05-03: 5 mg/h via INTRAVENOUS
  Filled 2019-05-03 (×2): qty 200

## 2019-05-03 MED ORDER — IOHEXOL 350 MG/ML SOLN
100.0000 mL | Freq: Once | INTRAVENOUS | Status: AC | PRN
  Administered 2019-05-03: 100 mL via INTRAVENOUS

## 2019-05-03 MED ORDER — HYDRALAZINE HCL 20 MG/ML IJ SOLN
10.0000 mg | Freq: Once | INTRAMUSCULAR | Status: AC
  Administered 2019-05-03: 10 mg via INTRAVENOUS
  Filled 2019-05-03: qty 1

## 2019-05-03 MED ORDER — LORAZEPAM 2 MG/ML IJ SOLN
1.0000 mg | Freq: Once | INTRAMUSCULAR | Status: AC
  Administered 2019-05-03: 1 mg via INTRAVENOUS
  Filled 2019-05-03: qty 1

## 2019-05-03 MED ORDER — LABETALOL HCL 5 MG/ML IV SOLN: 10.0000 mg | INTRAVENOUS | Status: DC | PRN

## 2019-05-03 MED ORDER — CHLORHEXIDINE GLUCONATE CLOTH 2 % EX PADS
6.0000 | MEDICATED_PAD | Freq: Every day | CUTANEOUS | Status: DC
  Administered 2019-05-03: 6 via TOPICAL

## 2019-05-03 MED ORDER — HEPARIN SODIUM (PORCINE) 5000 UNIT/ML IJ SOLN
5000.0000 [IU] | Freq: Three times a day (TID) | INTRAMUSCULAR | Status: DC
  Administered 2019-05-03 – 2019-05-04 (×3): 5000 [IU] via SUBCUTANEOUS
  Filled 2019-05-03 (×4): qty 1

## 2019-05-03 MED ORDER — LOSARTAN POTASSIUM-HCTZ 100-25 MG PO TABS: 1.0000 | ORAL_TABLET | Freq: Every day | ORAL | Status: DC

## 2019-05-03 MED ORDER — IPRATROPIUM-ALBUTEROL 0.5-2.5 (3) MG/3ML IN SOLN
3.0000 mL | Freq: Four times a day (QID) | RESPIRATORY_TRACT | Status: DC
  Administered 2019-05-04: 3 mL via RESPIRATORY_TRACT
  Filled 2019-05-03: qty 3

## 2019-05-03 MED ORDER — ACETAMINOPHEN 500 MG PO TABS
500.0000 mg | ORAL_TABLET | Freq: Four times a day (QID) | ORAL | Status: AC | PRN
  Administered 2019-05-03 – 2019-05-04 (×2): 500 mg via ORAL
  Filled 2019-05-03 (×2): qty 1

## 2019-05-03 MED ORDER — FENTANYL CITRATE (PF) 100 MCG/2ML IJ SOLN
50.0000 ug | Freq: Once | INTRAMUSCULAR | Status: DC
  Filled 2019-05-03: qty 2

## 2019-05-03 MED ORDER — FENTANYL CITRATE (PF) 100 MCG/2ML IJ SOLN
50.0000 ug | Freq: Once | INTRAMUSCULAR | Status: AC
  Administered 2019-05-03: 50 ug via INTRAVENOUS
  Filled 2019-05-03: qty 2

## 2019-05-03 NOTE — H&P (Signed)
NAME:  Jill Newton, MRN:  [id number], DOB:  02-26-1955, LOS: 0 ADMISSION DATE:  05/03/2019, CONSULTATION DATE:  05/03/2019 REFERRING MD:  - EDP, CHIEF COMPLAINT:  Hypertensive emergency  Brief History   Patient confused somewhat and not able to give good history  History of present illness   Per EDP - dizziness, diaphoresis for 2 days along with 2 days of chronically felt chest tightness 10/10. SBP 190/100 and in ER as high as 210/100 . Started on cardene and sbp 150s at time of CCM eval in bed 034 in ER. Patient confused intemittently. Not on ventilator. Wants to go home but is confused. No dissection on CT . Emphysema +  Taking tramadol, xanax, sucraflata, andtylenol #3 at home but not takin anti-depressants or BP meds U Tox positive for benzo, opiopids and MJ  Past Medical History     has a past medical history of Anxiety, Chest pain, COPD (chronic obstructive pulmonary disease) ([place]), Depression, Dyspnea, Edema, Enlarged heart, GERD (gastroesophageal reflux disease), Hyperlipidemia, Hypertension, Migraine, Palpitations, and Peptic ulcer.   reports that she has been smoking cigarettes. She has a 23.00 pack-year smoking history. She has never used smokeless tobacco.  Past Surgical History:  Procedure Laterality Date  . [place] STUDY N/A 06/20/2017   Procedure: [place] STUDY;  Surgeon: Yetta Flock, MD;  Location: [facility];  Service: Gastroenterology;  Laterality: N/A;  . CARDIAC CATHETERIZATION    . ESOPHAGEAL MANOMETRY N/A 06/20/2017   Procedure: ESOPHAGEAL MANOMETRY (EM);  Surgeon: Yetta Flock, MD;  Location: [facility];  Service: Gastroenterology;  Laterality: N/A;  . TUBAL LIGATION      Allergies  Allergen Reactions  . Codeine Nausea Only     There is no immunization history on file for this patient.  Family History  Problem Relation Age of Onset  . Congestive Heart Failure Mother   . Diabetes Mother   . Hypertension Mother   .  Stroke Father   . Cancer Brother        type unknown  . Heart disease Sister   . Bone cancer Maternal Aunt   . Cancer Maternal Uncle        type unknown  . Diabetes Sister      Current Facility-Administered Medications:  .  fentaNYL (SUBLIMAZE) injection 50 mcg, 50 mcg, Intravenous, Once, Tegeler, Gwenyth Allegra, MD .  heparin injection 5,000 Units, 5,000 Units, Subcutaneous, Q8H, Lahoma Constantin, MD .  magnesium sulfate IVPB 2 g 50 mL, 2 g, Intravenous, Once, Anwyn Kriegel, MD .  nicardipine (CARDENE) 20mg  in 0.86% saline 233ml IV infusion (0.1 mg/ml), 3-15 mg/hr, Intravenous, Continuous, Tegeler, Gwenyth Allegra, MD, Last Rate: 50 mL/hr at 05/03/19 1809, 5 mg/hr at 05/03/19 1809  Current Outpatient Medications:  .  ALPRAZolam (XANAX) 1 MG tablet, Take 1 mg by mouth 3 (three) times daily., Disp: , Rfl:  .  losartan-hydrochlorothiazide (HYZAAR) 100-25 MG tablet, Take 1 tablet by mouth daily., Disp: 90 tablet, Rfl: 0 .  sucralfate (CARAFATE) 1 g tablet, Take 1 g by mouth 2 (two) times daily. , Disp: , Rfl:  .  traMADol (ULTRAM) 50 MG tablet, Take 100 mg by mouth 3 (three) times daily., Disp: , Rfl:  .  albuterol (PROVENTIL HFA;VENTOLIN HFA) 108 (90 Base) MCG/ACT inhaler, Inhale 1-2 puffs into the lungs every 6 (six) hours as needed for wheezing or shortness of breath., Disp: 1 Inhaler, Rfl: 0 .  atorvastatin (LIPITOR) 10 MG tablet, TAKE 1 TABLET(10 MG)  BY MOUTH DAILY, Disp: 30 tablet, Rfl: 1 .  diltiazem (CARDIZEM) 60 MG tablet, TAKE 1 TABLET(60 MG) BY MOUTH TWICE DAILY, Disp: 180 tablet, Rfl: 1 .  gabapentin (NEURONTIN) 300 MG capsule, TAKE 1 CAPSULE BY MOUTH TWICE DAILY. START OFF TAKING 1 AT BEDTIME FOR 1 WEEK AND MAY INCREASE TO 2 TIMES DAILY, Disp: 60 capsule, Rfl: 2 .  hydrochlorothiazide (HYDRODIURIL) 25 MG tablet, Take 1 tablet (25 mg total) by mouth daily. (Patient not taking: Reported on 05/03/2019), Disp: 90 tablet, Rfl: 3 .  linaclotide (LINZESS) 72 MCG capsule, Take 72 mcg by  mouth as needed., Disp: , Rfl:  .  losartan (COZAAR) 100 MG tablet, Take 1 tablet (100 mg total) by mouth daily. (Patient not taking: Reported on 05/03/2019), Disp: 90 tablet, Rfl: 0 .  omeprazole (PRILOSEC) 20 MG capsule, Take 1 capsule (20 mg total) by mouth daily., Disp: 30 capsule, Rfl: 0 .  Vitamin D, Ergocalciferol, (DRISDOL) 50000 units CAPS capsule, Take 1 capsule by mouth once a week., Disp: , Rfl: [facility]   05/03/2019 - admit cardene gtt  Consults:  x  Procedures:  x  Significant Diagnostic Tests:  x  Micro Data:  x  Antimicrobials:  x   Interim history/subjective:  3/13 - seen in ER bed 034.  Objective   Blood pressure (!) 152/77, pulse 83, temperature 98.2 F (36.8 C), temperature source Oral, resp. rate (!) 25, height 5\' 4"  (1.626 m), weight 99.8 kg, SpO2 98 %.       No intake or output data in the 24 hours ending 05/03/19 1841 Filed Weights   05/03/19 1140  Weight: 99.8 kg    Examination: General: in Bed ER 034.  HENT: no elevated JVP. No neck nodes Lungs: CTa bilaterally. No distress Cardiovascular: normal heart sounds Abdomen: soft, non tender, no organomegaly Extremities: no cyanosis, no clubbing no edema Neuro: CAM-ICU positive for delirium +. RASS +1. Moves all 4s. Follows commands.  GU: not examined  Resolved Hospital Problem list   x  Assessment & Plan:  Hypertensive Emergency with delirium/acute encephalopathy  Plan  - cardenge gtt - 25% reduction in SBP - goal 160-180 (currently 150) - trop check tomorrow 3/14 - check echo  Acute encephlopathy - UTox positive for benzo, opioids and [place]   - CT  Head without contrast - Might need MRI to eval for PRES - precedex if it translates into agitation - HIV  Mild AKI (baseline 0.8) - admit 1.08  Plan  - control BP and monitor - D5 LR at kvo - check UA   COPD NOS and with smoking hx  - no evidence of aecopd  Plan  - Nebs  Hx of GERD  - ppi  Hx  of DM  - ssi  Electrolyte imbalance   - replete mag (currently 1.9)   Best practice:  Diet: npo except meds Pain/Anxiety/Delirium protocol (if indicated): precedex if needed VAP protocol (if indicated): hob > 30 DVT prophylaxis: heparin sq GI prophylaxis: ppi Glucose control: ssi Mobility: bed rest Code Status: full Family Communication: patient at bedside but she is confused. Informed her she needs admot. Called son Dory [place] on both numbers listed at 7:09 PM 05/03/2019 but no response. LMTCB on the home line Disposition: ER to ICU     ATTESTATION & SIGNATURE   The patient Tannis Dyment is critically ill with multiple organ systems failure and requires high complexity decision making for assessment and support,  frequent evaluation and titration of therapies, application of advanced monitoring technologies and extensive interpretation of multiple databases.   Critical Care Time devoted to patient care services described in this note is  60  Minutes. This time reflects time of care of this signee Dr Brand Males. This critical care time does not reflect procedure time, or teaching time or supervisory time of PA/NP/Med student/Med Resident etc but could involve care discussion time     Dr. Brand Males, M.D., [facility].C.P Pulmonary and Critical Care Medicine Staff Physician [place] [facility] Pager: [phone], If no answer or between  15:00h - 7:00h: call [phone]  05/03/2019 6:41 PM    LABS    PULMONARY No results for input(s): PHART, PCO2ART, PO2ART, HCO3, TCO2, O2SAT in the last 168 hours.  Invalid input(s): PCO2, PO2  CBC Recent Labs  Lab 05/03/19 1145  HGB 13.9  HCT 44.6  WBC 3.9*  PLT 158    COAGULATION No results for input(s): INR in the last 168 hours.  CARDIAC  No results for input(s): TROPONINI in the last 168 hours. No results for input(s): PROBNP in the last 168  hours.   CHEMISTRY Recent Labs  Lab 05/03/19 1145  NA 135  K 3.5  CL 99  CO2 22  GLUCOSE 158*  BUN 12  CREATININE 1.04*  CALCIUM 9.2  MG 1.9   Estimated Creatinine Clearance: 62.7 mL/min (A) (by C-G formula based on SCr of 1.04 mg/dL (H)).   LIVER Recent Labs  Lab 05/03/19 1145  AST 21  ALT 30  ALKPHOS 75  BILITOT 1.0  PROT 7.4  ALBUMIN 4.0     INFECTIOUS Recent Labs  Lab 05/03/19 1241  LATICACIDVEN 1.7     ENDOCRINE CBG (last 3)  No results for input(s): GLUCAP in the last 72 hours.       IMAGING x48h  - image(s) personally visualized  -   highlighted in bold DG Chest Portable 1 View  Result Date: 05/03/2019 CLINICAL DATA:  Called EMS for new dizziness and diaphoresis for 2 days along with 2 days worsening of chronically felt chest tightness EXAM: PORTABLE CHEST 1 VIEW COMPARISON:  10/04/2017 FINDINGS: Cardiac silhouette normal in size.  No mediastinal or hilar masses. Prominent vascular markings similar to the prior exam. Lungs otherwise clear. No pleural effusion or pneumothorax. Skeletal structures are grossly intact. IMPRESSION: No acute cardiopulmonary disease. Electronically Signed   By: Lajean Manes M.D.   On: 05/03/2019 12:25   CT Angio Chest/Abd/Pel for Dissection W and/or Wo Contrast  Result Date: 05/03/2019 CLINICAL DATA:  Chest pain and dizziness, rule out aortic dissection EXAM: CT ANGIOGRAPHY CHEST, ABDOMEN AND PELVIS TECHNIQUE: Multidetector CT imaging through the chest, abdomen and pelvis was performed using the standard protocol during bolus administration of intravenous contrast. Multiplanar reconstructed images and MIPs were obtained and reviewed to evaluate the vascular anatomy. CONTRAST:  150mL OMNIPAQUE IOHEXOL 350 MG/ML SOLN COMPARISON:  11/01/2017 FINDINGS: CTA CHEST FINDINGS Cardiovascular: Preferential opacification of the thoracic aorta. Normal contour and caliber of the thoracic aorta. No evidence of aortic aneurysm, dissection,  or other acute aortic pathology. No significant atherosclerosis. Normal heart size. No pericardial effusion. Mediastinum/Nodes: No enlarged mediastinal, hilar, or axillary lymph nodes. Thyroid gland, trachea, and esophagus demonstrate no significant findings. Lungs/Pleura: Mild centrilobular and paraseptal emphysema. There is bronchial wall thickening and plugging in the right lower lobe with minimal associated atelectasis or consolidation of the dependent right lung.  No pleural effusion or pneumothorax. Musculoskeletal: No chest wall abnormality. No acute or significant osseous findings. Review of the MIP images confirms the above findings. CTA ABDOMEN AND PELVIS FINDINGS VASCULAR Normal contour and caliber of the abdominal aorta without evidence of aneurysm, dissection, or other acute aortic pathology. There is variant origin of the right gastric artery directly from the abdominal aorta. Prominent impression of the diaphragmatic crus on the celiac axis origin. Otherwise standard branching pattern of the abdominal aorta, with solitary bilateral renal arteries. Minimal mixed atherosclerosis of the infrarenal abdominal aorta. Review of the MIP images confirms the above findings. NON-VASCULAR Hepatobiliary: No solid liver abnormality is seen. No gallstones, gallbladder wall thickening, or biliary dilatation. Pancreas: Unremarkable. No pancreatic ductal dilatation or surrounding inflammatory changes. Spleen: Normal in size without significant abnormality. Adrenals/Urinary Tract: Adrenal glands are unremarkable. Kidneys are normal, without renal calculi, solid lesion, or hydronephrosis. Bladder is unremarkable. Stomach/Bowel: Stomach is within normal limits. Appendix appears normal. No evidence of bowel wall thickening, distention, or inflammatory changes. Lymphatic: No enlarged abdominal or pelvic lymph nodes. Reproductive: No mass or other significant abnormality. Other: No abdominal wall hernia or abnormality. No  abdominopelvic ascites. Musculoskeletal: No acute or significant osseous findings. Review of the MIP images confirms the above findings. IMPRESSION: 1. Normal contour and caliber of the thoracic and abdominal aorta. No evidence of aortic aneurysm, dissection or other acute aortic pathology. Minimal atherosclerosis of the infrarenal abdominal aorta. Aortic Atherosclerosis (ICD10-I70.0). 2. Bronchial wall thickening and plugging in the right lower lobe with minimal associated atelectasis or consolidation of the dependent right lung, findings suggestive of aspiration. 3. Emphysema (ICD10-J43.9). Electronically Signed   By: Eddie Candle M.D.   On: 05/03/2019 17:27

## 2019-05-03 NOTE — ED Notes (Signed)
Nicardipine drip stopped per [facility] as ordered

## 2019-05-03 NOTE — Progress Notes (Signed)
PCCM Interval progress:  Pt transferred to the ICU from ED, now weaned off Cardene gtt with SBP 120 and resting comfortably.  Will resume home Cozaar/HCTZ with prn Labetalol.  Initial BP 210/102, Goal SBP reduction 20% initially.   Otilio Carpen Casady Voshell, PA-C

## 2019-05-03 NOTE — ED Notes (Signed)
Patient transported to CT 

## 2019-05-03 NOTE — ED Notes (Signed)
Report given to [facility], Therapist, sports. All questions answered completely.

## 2019-05-03 NOTE — ED Notes (Signed)
Came to pt room with requested pain medication (2nd dose of fentanyl 11mcg). 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 Requests xanax, EDP notified and ativan ordered. When  Ativan brought to pt, pt allows RN to provide but states "if he is not going to be giving me that xanax like I ran out of, I just need to go home." Pt calls family and asks to pick her up despite stating that she is unable to walk d/t dizziness. Encouraged pt to wait to leave AMA until EDP can speak with her. Pt agrees, EDP notified, at bedside.

## 2019-05-03 NOTE — ED Triage Notes (Addendum)
Pt presents from home where she lives alone but is frequently visited by son. Called EMS for new dizziness and diaphoresis for 2 days along with 2 days worsening of chronically felt chest tightness, 10/10. Pt states she has been follow by PCP for this chest tightness for years, takes tramadol. Dizziness relieved by changes in position. Pt received 324mg ASA pta.  EMS exam  190/100 (150/90 en route), HR 43-54bpm (pt states this is not abnormal), widened QT interval, possible delta waves (pt denies WPW).  H/o htn  Endorse marijuana use but denies using in past 2 days d/t dizziness. Pt states that she took one oher neighbor's pain pills, wonders if that is why she is feeling poorly.

## 2019-05-03 NOTE — ED Provider Notes (Signed)
Patient checked out by Dr. Sherry Ruffing for hypertensive emergency.  Patient is currently on a Cardene drip at 10 with blood pressure at [id number] systolic.  Due to her chest discomfort, questionable prior aortic dilation and new hypertensive urgency CTA was done to rule out dissection.  CTA is negative for dissection.  We will plan on admitting for hypertensive emergency.  Symptoms have been ongoing for the last 2 days despite patient taking her medications.   Blanchie Dessert, MD 05/03/19 1742

## 2019-05-03 NOTE — ED Provider Notes (Signed)
[facility] Provider Note   CSN: [id number] Arrival date & time: 05/03/19  1121     History Chief Complaint  Patient presents with  . Dizziness    Jill Newton is a 64 y.o. female.  The history is provided by the patient and medical records. No language interpreter was used.  Dizziness Quality:  Lightheadedness Severity:  Severe Onset quality:  Gradual Duration:  2 days Timing:  Constant Progression:  Worsening Chronicity:  Recurrent Relieved by:  Lying down Worsened by:  Standing up Ineffective treatments:  None tried Associated symptoms: chest pain and shortness of breath   Associated symptoms: no diarrhea, no headaches, no nausea, no palpitations, no vomiting and no weakness        Past Medical History:  Diagnosis Date  . Anxiety   . Chest pain   . COPD (chronic obstructive pulmonary disease) (HCC)    Wheezing, SOB  . Depression   . Dyspnea    When going up inclines  . Edema    In ankles. Since going off meds  . Enlarged heart   . GERD (gastroesophageal reflux disease)   . Hyperlipidemia   . Hypertension   . Migraine   . Palpitations   . Peptic ulcer     Patient Active Problem List   Diagnosis Date Noted  . Mixed hyperlipidemia 08/09/2017  . Anxiety 08/09/2017  . Cigarette nicotine dependence without complication [id number]  . Dysphagia   . Atypical chest pain   . Cigarette smoker 06/03/2016  . COPD  GOLD 0 active smoker   . Essential hypertension   . Enlarged heart   . GERD (gastroesophageal reflux disease)   . Chest pain     Past Surgical History:  Procedure Laterality Date  . [place] STUDY N/A 06/20/2017   Procedure: [place] STUDY;  Surgeon: Yetta Flock, MD;  Location: [facility];  Service: Gastroenterology;  Laterality: N/A;  . CARDIAC CATHETERIZATION    . ESOPHAGEAL MANOMETRY N/A 06/20/2017   Procedure: ESOPHAGEAL MANOMETRY (EM);  Surgeon: Yetta Flock, MD;   Location: [facility];  Service: Gastroenterology;  Laterality: N/A;  . TUBAL LIGATION       OB History   No obstetric history on file.     Family History  Problem Relation Age of Onset  . Congestive Heart Failure Mother   . Diabetes Mother   . Hypertension Mother   . Stroke Father   . Cancer Brother        type unknown  . Heart disease Sister   . Bone cancer Maternal Aunt   . Cancer Maternal Uncle        type unknown  . Diabetes Sister     Social History   Tobacco Use  . Smoking status: Current Every Day Smoker    Packs/day: 0.50    Years: 46.00    Pack years: 23.00    Types: Cigarettes  . Smokeless tobacco: Never Used  Substance Use Topics  . Alcohol use: No  . Drug use: Yes    Types: Marijuana    Home Medications Prior to Admission medications   Medication Sig Start Date End Date Taking? Authorizing Provider  albuterol (PROVENTIL HFA;VENTOLIN HFA) 108 (90 Base) MCG/ACT inhaler Inhale 1-2 puffs into the lungs every 6 (six) hours as needed for wheezing or shortness of breath. 10/04/17   Palumbo, April, MD  atorvastatin (LIPITOR) 10 MG tablet TAKE 1 TABLET(10 MG) BY MOUTH DAILY 12/28/17  Billie Ruddy, MD  diltiazem (CARDIZEM) 60 MG tablet TAKE 1 TABLET(60 MG) BY MOUTH TWICE DAILY 10/19/17   Armbruster, Carlota Raspberry, MD  gabapentin (NEURONTIN) 300 MG capsule TAKE 1 CAPSULE BY MOUTH TWICE DAILY. START OFF TAKING 1 AT BEDTIME FOR 1 WEEK AND MAY INCREASE TO 2 TIMES DAILY 06/03/18   Billie Ruddy, MD  hydrochlorothiazide (HYDRODIURIL) 25 MG tablet Take 1 tablet (25 mg total) by mouth daily. 05/02/18   Billie Ruddy, MD  linaclotide (LINZESS) 72 MCG capsule Take 72 mcg by mouth as needed.    [provider]  losartan (COZAAR) 100 MG tablet Take 1 tablet (100 mg total) by mouth daily. 05/02/18   Billie Ruddy, MD  losartan-hydrochlorothiazide (HYZAAR) 100-25 MG tablet Take 1 tablet by mouth daily. 04/30/18   Billie Ruddy, MD  omeprazole (PRILOSEC) 20 MG  capsule Take 1 capsule (20 mg total) by mouth daily. 10/04/17   Palumbo, April, MD  predniSONE (DELTASONE) 20 MG tablet 3 tabs po day one, then 2 po daily x 4 days 10/04/17   Palumbo, April, MD  Vitamin D, Ergocalciferol, (DRISDOL) 50000 units CAPS capsule Take 1 capsule by mouth once a week. 08/07/17   [provider]    Allergies    Codeine  Review of Systems   Review of Systems  Constitutional: Positive for diaphoresis and fatigue. Negative for chills and fever.  HENT: Negative for congestion.   Eyes: Negative for visual disturbance.  Respiratory: Positive for chest tightness and shortness of breath. Negative for cough and wheezing.   Cardiovascular: Positive for chest pain. Negative for palpitations and leg swelling.  Gastrointestinal: Positive for abdominal pain (upper). Negative for constipation, diarrhea, nausea and vomiting.  Genitourinary: Negative for dysuria and flank pain.  Musculoskeletal: Negative for back pain, neck pain and neck stiffness.  Skin: Negative for rash and wound.  Neurological: Positive for light-headedness. Negative for dizziness, syncope, speech difficulty, weakness, numbness and headaches.  Psychiatric/Behavioral: Negative for agitation.  All other systems reviewed and are negative.   Physical Exam Updated Vital Signs BP (!) 210/102   Pulse 66   Temp 98.2 F (36.8 C) (Oral)   Resp 12   Ht 5\' 4"  (1.626 m)   Wt 99.8 kg   SpO2 98%   BMI 37.76 kg/m   Physical Exam Vitals and nursing note reviewed.  Constitutional:      General: She is not in acute distress.    Appearance: She is well-developed. She is not ill-appearing, toxic-appearing or diaphoretic.  HENT:     Head: Normocephalic and atraumatic.     Right Ear: External ear normal.     Left Ear: External ear normal.     Nose: Nose normal. No congestion or rhinorrhea.     Mouth/Throat:     Mouth: Mucous membranes are moist.     Pharynx: No oropharyngeal exudate or posterior  oropharyngeal erythema.  Eyes:     Extraocular Movements: Extraocular movements intact.     Conjunctiva/sclera: Conjunctivae normal.     Pupils: Pupils are equal, round, and reactive to light.  Cardiovascular:     Rate and Rhythm: Regular rhythm. Bradycardia present.     Pulses: Normal pulses.     Heart sounds: No murmur.  Pulmonary:     Effort: Pulmonary effort is normal. No respiratory distress.     Breath sounds: No stridor. Rales present. No wheezing or rhonchi.  Chest:     Chest wall: Tenderness present.  Abdominal:  General: Abdomen is flat. There is no distension.     Tenderness: There is no left CVA tenderness or rebound.  Musculoskeletal:        General: No tenderness.     Cervical back: Normal range of motion and neck supple. No tenderness.     Right lower leg: Edema present.     Left lower leg: Edema present.  Skin:    General: Skin is warm.     Capillary Refill: Capillary refill takes less than 2 seconds.     Findings: No erythema or rash.  Neurological:     Mental Status: She is alert and oriented to person, place, and time.     Motor: No abnormal muscle tone.     Coordination: Coordination normal.     Deep Tendon Reflexes: Reflexes are normal and symmetric.  Psychiatric:        Mood and Affect: Mood normal.     ED Results / Procedures / Treatments   Labs (all labs ordered are listed, but only abnormal results are displayed) Labs Reviewed  CBC WITH DIFFERENTIAL/PLATELET - Abnormal; Notable for the following components:      Result Value   WBC 3.9 (*)    RBC 5.32 (*)    RDW 17.2 (*)    Lymphs Abs 0.6 (*)    All other components within normal limits  COMPREHENSIVE METABOLIC PANEL - Abnormal; Notable for the following components:   Glucose, Bld 158 (*)    Creatinine, Ser 1.04 (*)    GFR calc non Af Amer 57 (*)    All other components within normal limits  URINALYSIS, ROUTINE W REFLEX MICROSCOPIC - Abnormal; Notable for the following components:    APPearance HAZY (*)    Ketones, ur 20 (*)    Protein, ur 30 (*)    Bacteria, UA MANY (*)    All other components within normal limits  RAPID URINE DRUG SCREEN, HOSP PERFORMED - Abnormal; Notable for the following components:   Opiates POSITIVE (*)    Benzodiazepines POSITIVE (*)    Tetrahydrocannabinol POSITIVE (*)    All other components within normal limits  URINE CULTURE  SARS CORONAVIRUS 2 (TAT 6-24 HRS)  LACTIC ACID, PLASMA  LIPASE, BLOOD  BRAIN NATRIURETIC PEPTIDE  MAGNESIUM  TROPONIN I (HIGH SENSITIVITY)  TROPONIN I (HIGH SENSITIVITY)    EKG EKG Interpretation  Date/Time:  Saturday May 03 2019 11:27:06 EST Ventricular Rate:  53 PR Interval:    QRS Duration: 90 QT Interval:  698 QTC Calculation: 656 R Axis:   7 Text Interpretation: Sinus rhythm Abnormal R-wave progression, early transition Borderline T abnormalities, inferior leads Prolonged QT interval When compared to prior, much longer QTC. bradycardia present. No STEMI Confirmed by Antony Blackbird [phone]) on 05/03/2019 11:29:57 AM   Radiology DG Chest Portable 1 View  Result Date: 05/03/2019 CLINICAL DATA:  Called EMS for new dizziness and diaphoresis for 2 days along with 2 days worsening of chronically felt chest tightness EXAM: PORTABLE CHEST 1 VIEW COMPARISON:  10/04/2017 FINDINGS: Cardiac silhouette normal in size.  No mediastinal or hilar masses. Prominent vascular markings similar to the prior exam. Lungs otherwise clear. No pleural effusion or pneumothorax. Skeletal structures are grossly intact. IMPRESSION: No acute cardiopulmonary disease. Electronically Signed   By: Lajean Manes M.D.   On: 05/03/2019 12:25    Procedures Procedures (including critical care time)  CRITICAL CARE Performed by: Gwenyth Allegra Veronica Fretz Total critical care time: 45 minutes Critical care time was exclusive of separately  billable procedures and treating other patients. Critical care was necessary to treat or prevent imminent  or life-threatening deterioration. Critical care was time spent personally by me on the following activities: development of treatment plan with patient and/or surrogate as well as nursing, discussions with consultants, evaluation of patient's response to treatment, examination of patient, obtaining history from patient or surrogate, ordering and performing treatments and interventions, ordering and review of laboratory studies, ordering and review of radiographic studies, pulse oximetry and re-evaluation of patient's condition.   Medications Ordered in ED Medications  fentaNYL (SUBLIMAZE) injection 50 mcg (50 mcg Intravenous Refused 05/03/19 1533)  LORazepam (ATIVAN) injection 2 mg (has no administration in time range)  nicardipine (CARDENE) 20mg  in 0.86% saline 258ml IV infusion (0.1 mg/ml) (has no administration in time range)  hydrALAZINE (APRESOLINE) injection 10 mg (10 mg Intravenous Given 05/03/19 1310)  fentaNYL (SUBLIMAZE) injection 50 mcg (50 mcg Intravenous Given 05/03/19 1338)  LORazepam (ATIVAN) injection 1 mg (1 mg Intravenous Given 05/03/19 1543)    ED Course  I have reviewed the triage vital signs and the nursing notes.  Pertinent labs & imaging results that were available during my care of the patient were reviewed by me and considered in my medical decision making (see chart for details).    MDM Rules/Calculators/A&P                      Jill Newton is a 64 y.o. female with a past medical history significant for chronic chest discomfort, hypertension, enlarged heart, COPD, hyperlipidemia, and hypertension who presents with near syncope, lightheadedness, fatigue, worsening shortness of breath and chest tightness.  Patient reports that she called EMS today when she has had 2 days of worsening diaphoresis and lightheadedness.  She reports she cannot stand up without feeling like she is going to pass out feeling near syncopal.  She reports he chronically has chest  discomfort and although it is slightly worse than normal, it is very similar to prior with chest tenderness that radiates across her chest.  She reports has been worked up in the past by cardiology and GI and it is still unclear what was causing her symptoms.  She reports that her blood pressure can be elevated at times and she has had history of slow heart rate in the past as well.  She was found to EMS to have blood pressure elevated at around 190 and found to have heart rates as low as the low 40s at 43.  She reports that she cannot sit up or stand up without feel like she is going to pass out again.  She reports some nausea but no vomiting.  She denies any constipation, diarrhea, or urinary changes.  She reports chronic edema that does not appear worsened.  She reports that her shortness of breath is worse when she tries to lay flat.  She denies fevers or chills but does report a cough.  She denies any Covid contacts.  On exam, patient is bradycardic with a rate in the 40s.  Her blood pressure is elevated at 193/89 on arrival.  She denies any headache or neurologic deficits at this time.  She denies true dizziness but instead reports the lightheadedness is her near syncope feeling.  Lungs had some crackles in the bases as well as some rhonchi.  No wheezing.  Chest was tender in the central/right chest which she reports is her chronic chest discomfort location.  I could reproduce her discomfort.  Abdomen was nontender.  She did have pulses in all extremities with similar extremity edema seen.  No other focal neurologic deficit seen.  EKG shows sinus bradycardia however her QTC is long at 656.  No STEMI.  Considering symptomatic bradycardia as the cause of her lightheaded episodes and near syncope versus hypertensive emergency.  We will give her hydralazine to help with her blood pressure to see if this helps.  Will avoid beta-blockers.  We will also look for CHF exacerbation given the worsening shortness of  breath and chest tightness especially when lying flat.  We will get other labs look for electrolyte imbalance.  Given her lack of headache, or neurologic deficits, low suspicion for intracranial cause for her symptoms.  Anticipate reassessment after work-up.  If patient is unable to successfully stand up or ambulate due to the near syncope with her abnormal vital signs, patient may require admission.  4:13 PM Patient's work-up showed troponin negative x2.  Lactic acid not elevated.  No evidence of infection on the urine.  Patient was negative for cocaine amphetamines.  Patient does report to me the last month she used some cocaine but has not done so in over a month.  Her CBC was reassuring and her CMP did not show evidence of significant abnormalities.  Her lipase not elevated. Her BNP was normal and her chest x-ray unremarkable.  On reassessment, her blood pressure is still 210/102 despite several doses of hydralazine.  She is still having bradycardia with rates in the 40s and 50s.  I do not feel that benzos are an option for her at this time and I am concerned that her near syncope and lightheadedness are actually hypertensive emergency.  She reports he is having the chronic pain however with her continued high blood pressure with continued pain, she will likely need a dissection study prior to admission for the hypertensive emergency.  We will start her on a nicardipine drip after discussion with pharmacy and will get the CT scan.  She will be admitted after for hypertensive emergency.  Patient was asking for benzos and Xanax given her anxiety as this may be contributing to her blood pressure.  She will be given IV Ativan at this time.  Care transferred to Dr. Maryan Rued while awaiting results of CT scan and admission for hypertensive emergency causing the near syncope and lightheadedness.   Final Clinical Impression(s) / ED Diagnoses Final diagnoses:  Lightheadedness  Near syncope  Bradycardia   Hypertensive emergency  Chest pain, unspecified type     Clinical Impression: 1. Lightheadedness   2. Near syncope   3. Bradycardia   4. Hypertensive emergency   5. Chest pain, unspecified type     Disposition: Awaiting results of CT dissection study ended admission for hypertensive emergency.  This note was prepared with assistance of Systems analyst. Occasional wrong-word or sound-a-like substitutions may have occurred due to the inherent limitations of voice recognition software.      Kianni Lheureux, Gwenyth Allegra, MD 05/03/19 [phone]

## 2019-05-03 NOTE — Progress Notes (Signed)
eLink Physician-Brief Progress Note Patient Name: Jill Newton DOB: 05-Jul-1955 MRN: [id number]   Date of Service  05/03/2019  HPI/Events of Note  ED nurse called , SBP < 130. On cardene gtt at 2 mg/hr. Dr Wille Glaser notes seen. CTH film seen: no obvious CVA or bleed. Official report pending.  eICU Interventions  - DC Cardene. Going to Rm 90M 4.         Elmer Sow 05/03/2019, 8:29 PM

## 2019-05-03 NOTE — ED Notes (Signed)
Per 51M, pt needs rapid POC covid test prior to being brought up to floor. Covid swab collected, labeled with 2 pt identifiers, and brought to lab

## 2019-05-03 NOTE — ED Notes (Signed)
Pt transported to 7M RM 4 in NAD with all belongings on continuous monitors with this RN. Pt a&ox4, speaking in full sentences. VSS. Breathing easy, non-labored. Equal rise and fall of chest.

## 2019-05-03 NOTE — ED Notes (Signed)
Call made to 35M. Per staff, room is not clean at this time, unable to take report

## 2019-05-03 NOTE — ED Notes (Signed)
ICU paged with regards to patient's SBP of 134. Per MD Prudencio Burly, titrate pt to 1mg /hr until she goes up to ICU.

## 2019-05-03 NOTE — ED Notes (Signed)
Pt to CT scan.

## 2019-05-03 NOTE — ED Notes (Signed)
Heard pt talking to herself in room from hallway about her phone not being hooked up, went into room and verified phone and call bell working and within reach. Pt requests to lie on stomach, assisted to begin rolling. Pt asks to be returned to supine with head elevated as not able to tolerate requested prone position.

## 2019-05-03 NOTE — ED Notes (Signed)
Magnesium initiated per MAR. Name/DOB verified with pt. Labs drawn, labeled with 2 pt identifiers, and sent

## 2019-05-03 NOTE — Progress Notes (Signed)
Patient arrived via stretcher to 2M04 from ED. Patient is alert and oriented X4. VSS. D5LR@10  through RT PIV. Patient ambulates to bathroom as needed. Purse with patient at bedside. Elink notified that patient is c/o headache. Awaiting new orders. Will continue to monitor.

## 2019-05-03 NOTE — Progress Notes (Signed)
64 yr old female admitted to ICU for Hypertensive emergency on cardene gtt from ED.  Hx of COPD, HTN, HLD.   Data: CTH: no obvious CVA or ICB.   Camera: discussed with bed side RN. Now off of cardene. SBP 124. sats good. Encephalopathy improved.   Plan: - bed side MD/NP is going to see her soon. - can resume home meds as tolerated. - Covid neg-  -CTH neg.  - follow troponin, labs in AM. Dr Chase Caller notes seen.  - on VTE sq heparin.  - follow ECHO.

## 2019-05-03 NOTE — ED Notes (Signed)
Assumed care of pt. Pt alert, speaking in full sentences. Breathing easy, non-labored. VSS. Pt remains on nicardipine drip. Denies any needs at this time. Call light within reach. Will continue to monitor

## 2019-05-04 ENCOUNTER — Inpatient Hospital Stay (HOSPITAL_COMMUNITY): Payer: Medicare Other

## 2019-05-04 DIAGNOSIS — I351 Nonrheumatic aortic (valve) insufficiency: Secondary | ICD-10-CM

## 2019-05-04 LAB — ECHOCARDIOGRAM COMPLETE
Height: 64 in
Weight: 3520.31 oz

## 2019-05-04 LAB — CBC WITH DIFFERENTIAL/PLATELET
Abs Immature Granulocytes: 0.01 10*3/uL (ref 0.00–0.07)
Basophils Absolute: 0 10*3/uL (ref 0.0–0.1)
Basophils Relative: 0 %
Eosinophils Absolute: 0 10*3/uL (ref 0.0–0.5)
Eosinophils Relative: 0 %
HCT: 45.1 % (ref 36.0–46.0)
Hemoglobin: 14.8 g/dL (ref 12.0–15.0)
Immature Granulocytes: 0 %
Lymphocytes Relative: 20 %
Lymphs Abs: 1.2 10*3/uL (ref 0.7–4.0)
MCH: 26.4 pg (ref 26.0–34.0)
MCHC: 32.8 g/dL (ref 30.0–36.0)
MCV: 80.4 fL (ref 80.0–100.0)
Monocytes Absolute: 0.6 10*3/uL (ref 0.1–1.0)
Monocytes Relative: 10 %
Neutro Abs: 4 10*3/uL (ref 1.7–7.7)
Neutrophils Relative %: 70 %
Platelets: 143 10*3/uL — ABNORMAL LOW (ref 150–400)
RBC: 5.61 MIL/uL — ABNORMAL HIGH (ref 3.87–5.11)
RDW: 17.1 % — ABNORMAL HIGH (ref 11.5–15.5)
WBC: 5.8 10*3/uL (ref 4.0–10.5)
nRBC: 0 % (ref 0.0–0.2)

## 2019-05-04 LAB — BASIC METABOLIC PANEL
Anion gap: 12 (ref 5–15)
BUN: 8 mg/dL (ref 8–23)
CO2: 24 mmol/L (ref 22–32)
Calcium: 9.4 mg/dL (ref 8.9–10.3)
Chloride: 99 mmol/L (ref 98–111)
Creatinine, Ser: 0.94 mg/dL (ref 0.44–1.00)
GFR calc Af Amer: >60 mL/min (ref >60)
GFR calc non Af Amer: >60 mL/min (ref >60)
Glucose, Bld: 94 mg/dL (ref 70–99)
Potassium: 3.9 mmol/L (ref 3.5–5.1)
Sodium: 135 mmol/L (ref 135–145)

## 2019-05-04 LAB — URINE CULTURE

## 2019-05-04 LAB — MAGNESIUM: Magnesium: 2.3 mg/dL (ref 1.7–2.4)

## 2019-05-04 LAB — MRSA PCR SCREENING: MRSA by PCR: NEGATIVE

## 2019-05-04 LAB — PHOSPHORUS: Phosphorus: 2.8 mg/dL (ref 2.5–4.6)

## 2019-05-04 LAB — TROPONIN I (HIGH SENSITIVITY): Troponin I (High Sensitivity): 11 ng/L (ref <18)

## 2019-05-04 MED ORDER — IBUPROFEN 200 MG PO TABS
200.0000 mg | ORAL_TABLET | Freq: Once | ORAL | Status: AC
  Administered 2019-05-04: 200 mg via ORAL
  Filled 2019-05-04: qty 1

## 2019-05-04 MED ORDER — IPRATROPIUM-ALBUTEROL 0.5-2.5 (3) MG/3ML IN SOLN: 3.0000 mL | RESPIRATORY_TRACT | Status: DC | PRN

## 2019-05-04 MED ORDER — OXYCODONE-ACETAMINOPHEN 5-325 MG PO TABS
1.0000 | ORAL_TABLET | Freq: Once | ORAL | Status: DC
  Filled 2019-05-04: qty 1

## 2019-05-04 MED ORDER — IBUPROFEN 400 MG PO TABS
400.0000 mg | ORAL_TABLET | Freq: Three times a day (TID) | ORAL | Status: DC | PRN
  Administered 2019-05-04: 400 mg via ORAL
  Filled 2019-05-04: qty 1

## 2019-05-04 NOTE — Progress Notes (Signed)
   NAME:  Jill Newton, MRN:  [id number], DOB:  1955-05-09, LOS: 1 ADMISSION DATE:  05/03/2019, CONSULTATION DATE:  05/03/19 REFERRING MD:  EDP, CHIEF COMPLAINT:  headache  Brief History   Admitted 3/13 with hypertensive urgency, headache. Started on cardene gtt in ED on 3/13.   Interim history/subjective:  This morning off drip. Says she was taking meds at home. But was feeling unwell and didn't even feel like smoking her nightly marijuana. Says BP was high at home on home cuff and headache got worse which is why she presented to ED. Having a headache. Doesn't feel back to baseline.   Objective   Blood pressure 136/73, pulse 62, temperature 98.6 F (37 C), temperature source Oral, resp. rate 18, height 5\' 4"  (1.626 m), weight 99.8 kg, SpO2 97 %.        Intake/Output Summary (Last 24 hours) at 05/04/2019 0935 Last data filed at 05/04/2019 0800 Gross per 24 hour  Intake 335.31 ml  Output --  Net 335.31 ml   Filed Weights   05/03/19 1140 05/04/19 0328  Weight: 99.8 kg 99.8 kg    Examination: General: no distress, resting comfortably HENT: mmm Lungs: clear no wheezes or crackles Cardiovascular: RRR no mrg Abdomen: soft, non tender, no organomegaly Extremities: no cyanosis, no clubbing no edema Neuro: AO x 4. No focal deficits.   Assessment & Plan:  Hypertensive Urgency - improved, off cardene drip - follow up echo results - resumed home medications losartan and hctz - still having headache. Prn ibuprofen and acetaminophen  Tobacco and MJ use disorder - suspect COPD, no evidence of exacerbation - Prn nebs  Stable for transfer to RNF. Will sign out to [facility]  Lenice Llamas, MD Pulmonary [facility] [place] Pager: [place]   CBC: Recent Labs  Lab 05/03/19 1145 05/04/19 0332  WBC 3.9* 5.8  NEUTROABS 3.1 4.0  HGB 13.9 14.8  HCT 44.6 45.1  MCV 83.8 80.4  PLT 158 143*    Basic Metabolic  Panel: Recent Labs  Lab 05/03/19 1145 05/04/19 0330 05/04/19 0332  NA 135 135  --   K 3.5 3.9  --   CL 99 99  --   CO2 22 24  --   GLUCOSE 158* 94  --   BUN 12 8  --   CREATININE 1.04* 0.94  --   CALCIUM 9.2 9.4  --   MG 1.9  --  2.3  PHOS  --   --  2.8   GFR: Estimated Creatinine Clearance: 69.4 mL/min (by C-G formula based on SCr of 0.94 mg/dL). Recent Labs  Lab 05/03/19 1145 05/03/19 1241 05/04/19 0332  WBC 3.9*  --  5.8  LATICACIDVEN  --  1.7  --     Liver Function Tests: Recent Labs  Lab 05/03/19 1145  AST 21  ALT 30  ALKPHOS 75  BILITOT 1.0  PROT 7.4  ALBUMIN 4.0   Recent Labs  Lab 05/03/19 1145  LIPASE 15   No results for input(s): AMMONIA in the last 168 hours.  ABG    Component Value Date/Time   TCO2 23 10/04/2017 0056

## 2019-05-04 NOTE — Progress Notes (Signed)
[facility]aeLink Physician-Brief Progress Note Patient Name: Jill Newton DOB: 07-18-1955 MRN: [id number]   Date of Service  05/04/2019  HPI/Events of Note  Headache not better after tylenol. In ED got fentanyl.  Now SBP 140.   eICU Interventions  Percocet oral once. Codeine makes Nausea as allergy. Asp precautions.     Intervention Category Intermediate Interventions: Other:;Pain - evaluation and management  Elmer Sow 05/04/2019, 12:53 AM

## 2019-05-04 NOTE — Progress Notes (Signed)
eLink Physician-Brief Progress Note Patient Name: Jill Newton DOB: [birth year] MRN: [id number]   Date of Service  05/04/2019  HPI/Events of Note  Does not want percocet. asking for Brufen not want percocet.  eICU Interventions  200 mg Ibuprofen oral ordered for head ache. If worsening consider MRI in AM for any PRESS.      Intervention Category Minor Interventions: Other:  Elmer Sow 05/04/2019, 1:25 AM

## 2019-05-04 NOTE — Progress Notes (Signed)
  Echocardiogram 2D Echocardiogram has been performed.  Jill Newton 05/04/2019, 8:36 AM

## 2019-05-04 NOTE — Plan of Care (Signed)
  Problem: Education: Goal: Knowledge of General Education information will improve Description Including pain rating scale, medication(s)/side effects and non-pharmacologic comfort measures Outcome: Progressing   

## 2019-05-04 NOTE — Progress Notes (Signed)
Patient was transfer from 62M. Patient in bed, no headache at this time. Will continue to monitor patient.

## 2019-05-05 DIAGNOSIS — R079 Chest pain, unspecified: Secondary | ICD-10-CM

## 2019-05-05 DIAGNOSIS — R42 Dizziness and giddiness: Secondary | ICD-10-CM

## 2019-05-05 DIAGNOSIS — F419 Anxiety disorder, unspecified: Secondary | ICD-10-CM

## 2019-05-05 LAB — CBC WITH DIFFERENTIAL/PLATELET
Abs Immature Granulocytes: 0.01 10*3/uL (ref 0.00–0.07)
Basophils Absolute: 0 10*3/uL (ref 0.0–0.1)
Basophils Relative: 0 %
Eosinophils Absolute: 0 10*3/uL (ref 0.0–0.5)
Eosinophils Relative: 0 %
HCT: 42.7 % (ref 36.0–46.0)
Hemoglobin: 13.9 g/dL (ref 12.0–15.0)
Immature Granulocytes: 0 %
Lymphocytes Relative: 27 %
Lymphs Abs: 1.3 10*3/uL (ref 0.7–4.0)
MCH: 26.2 pg (ref 26.0–34.0)
MCHC: 32.6 g/dL (ref 30.0–36.0)
MCV: 80.4 fL (ref 80.0–100.0)
Monocytes Absolute: 0.6 10*3/uL (ref 0.1–1.0)
Monocytes Relative: 11 %
Neutro Abs: 3.1 10*3/uL (ref 1.7–7.7)
Neutrophils Relative %: 62 %
Platelets: 166 10*3/uL (ref 150–400)
RBC: 5.31 MIL/uL — ABNORMAL HIGH (ref 3.87–5.11)
RDW: 16.7 % — ABNORMAL HIGH (ref 11.5–15.5)
WBC: 5 10*3/uL (ref 4.0–10.5)
nRBC: 0 % (ref 0.0–0.2)

## 2019-05-05 LAB — MAGNESIUM: Magnesium: 1.9 mg/dL (ref 1.7–2.4)

## 2019-05-05 LAB — PHOSPHORUS: Phosphorus: 2.5 mg/dL (ref 2.5–4.6)

## 2019-05-05 NOTE — Discharge Summary (Signed)
Physician Discharge Summary   Patient ID: Jill Newton MRN: [id number] DOB/AGE: May 06, 1955 64 y.o.  Admit date: 05/03/2019 Discharge date: 05/05/2019                     Discharge Plan by Diagnosis   Hypertensive Urgency - resolved. Hx HTN, HLD. - Continue home losartan / HCTZ, lipitor. - F/u with PCP. - DASH diet, exercise encouraged and tobacco / THC cessation counseling as below.  Hx COPD. - Continue home inhalers.  Hx GERD, PUD. - Continue home sucralfate, PPI.  Hx migraines, depression, anxiety. - Continue home alprazolam, hydroxyzine, venlafaxine, gabapentin.  Tobacco dependence. - Tobacco cessation counseling.  Marijuana dependence. - Marijuana cessation counseling.   Discharge Summary  Jill Newton is a 64 y.o. y/o female with a PMH of HTN, HLD, COPD, tobacco dependence, GERD, PUD, migraines, depression, anxiety.  She was admitted 05/03/19 with hypertensive urgency, headache, chest disomfort.  In ED, BP as high as 210/100.  UDS positive for benzo's, opioids, marijuana.  She was started on cardene infusion and had excellent response.  CT head negative.  CTA chest negative.  Echo showed mild LVH with G1DD.   On 3/14, she was transitioned off cardene and continued on her home meds (losartan and HCTZ).  She was transferred out of the ICU to a regular floor.  On 3/15, she was deemed medically stable and was cleared for discharge to her home.  She has been instructed to continue to take her medications as prescribed, avoid substance / THC use, and follow up with her PCP.         Significant Hospital Events   3/13 > admit. 3/14 > transfer out of ICU. 3/15 > discharged.  Significant Diagnostic Studies  CT head 3/13 > neg. CTA chest/abd/pelv 3/13 > neg for dissection.  Emphysema. Echo 3/14 > EF 68%, G1DD, mild LVH.  Micro Data  COVID 3/13 > neg.  Antimicrobials  None.  Consults  None.  Objective:  Blood pressure (!) 141/83, pulse 61,  temperature 98.4 F (36.9 C), temperature source Oral, resp. rate 18, height '5\' 4"'$  (1.626 m), weight 95.2 kg, SpO2 96 %.        Intake/Output Summary (Last 24 hours) at 05/05/2019 1053 Last data filed at 05/05/2019 0852 Gross per 24 hour  Intake 100 ml  Output 0 ml  Net 100 ml   Filed Weights   05/04/19 0328 05/04/19 1346 05/04/19 2059  Weight: 99.8 kg 95.2 kg 95.2 kg    Physical Examination: General: Adult female, resting in bed, in NAD. Neuro: A&O x 3, non-focal.  HEENT: [place]/AT. EOMI, sclerae anicteric. Cardiovascular: RRR, no M/R/G.  Lungs: Respirations even and unlabored.  CTA bilaterally, No W/R/R.  Abdomen: BS x 4, soft, NT/ND.  Musculoskeletal: No gross deformities, no edema.  Skin: Intact, warm, no rashes.   Discharge Labs:  BMET Recent Labs  Lab 05/03/19 1145 05/04/19 0330 05/04/19 0332 05/05/19 0339  NA 135 135  --   --   K 3.5 3.9  --   --   CL 99 99  --   --   CO2 22 24  --   --   GLUCOSE 158* 94  --   --   BUN 12 8  --   --   CREATININE 1.04* 0.94  --   --   CALCIUM 9.2 9.4  --   --   MG 1.9  --  2.3 1.9  PHOS  --   --  2.8 2.5    CBC Recent Labs  Lab 05/03/19 1145 05/04/19 0332 05/05/19 0339  HGB 13.9 14.8 13.9  HCT 44.6 45.1 42.7  WBC 3.9* 5.8 5.0  PLT 158 143* 166    Anti-Coagulation No results for input(s): INR in the last 168 hours.  Discharge Instructions    Diet - low sodium heart healthy   Complete by: As directed    Increase activity slowly   Complete by: As directed       Allergies as of 05/05/2019      Reactions   Codeine Nausea Only      Medication List    STOP taking these medications   diltiazem 60 MG tablet Commonly known as: CARDIZEM   hydrochlorothiazide 25 MG tablet Commonly known as: HYDRODIURIL   losartan 100 MG tablet Commonly known as: Cozaar     TAKE these medications   albuterol 108 (90 Base) MCG/ACT inhaler Commonly known as: VENTOLIN HFA Inhale 1-2 puffs into the lungs every 6 (six) hours  as needed for wheezing or shortness of breath.   ALPRAZolam 1 MG tablet Commonly known as: XANAX Take 1 mg by mouth 3 (three) times daily.   atorvastatin 10 MG tablet Commonly known as: LIPITOR TAKE 1 TABLET(10 MG) BY MOUTH DAILY What changed: See the new instructions.   gabapentin 300 MG capsule Commonly known as: NEURONTIN TAKE 1 CAPSULE BY MOUTH TWICE DAILY. START OFF TAKING 1 AT BEDTIME FOR 1 WEEK AND MAY INCREASE TO 2 TIMES DAILY   hydrOXYzine 50 MG tablet Commonly known as: ATARAX/VISTARIL Take 100 mg by mouth at bedtime.   Linzess 72 MCG capsule Generic drug: linaclotide Take 72 mcg by mouth daily as needed (constipation).   losartan-hydrochlorothiazide 100-25 MG tablet Commonly known as: HYZAAR Take 1 tablet by mouth daily.   omeprazole 20 MG capsule Commonly known as: PRILOSEC Take 1 capsule (20 mg total) by mouth daily.   sucralfate 1 g tablet Commonly known as: CARAFATE Take 1 g by mouth 2 (two) times daily.   Symbicort 160-4.5 MCG/ACT inhaler Generic drug: budesonide-formoterol Inhale 2 puffs into the lungs in the morning and at bedtime.   traMADol 50 MG tablet Commonly known as: ULTRAM Take 100 mg by mouth 3 (three) times daily.   venlafaxine XR 75 MG 24 hr capsule Commonly known as: EFFEXOR-XR Take 75 mg by mouth daily.       Disposition: Home.   Discharge Condition:  Jill Newton has met maximum benefit of inpatient care and is medically stable and cleared for discharge.  Patient is pending follow up as above.     Time spent on discharge: Greater than 35 minutes.    Montey Hora, [facility] 05/05/2019, 10:53 AM

## 2019-05-05 NOTE — Progress Notes (Signed)
Patient discharged home with all belongings. Patient instructed to continue home BP meds. All questions answered. IV removed.

## 2019-05-05 NOTE — Progress Notes (Signed)
NAME:  Jill Newton, MRN:  [id number], DOB:  03-04-1955, LOS: 2 ADMISSION DATE:  05/03/2019, CONSULTATION DATE:  05/03/19 REFERRING MD:  EDP, CHIEF COMPLAINT:  headache  Brief History   Admitted 3/13 with hypertensive urgency, headache. Started on cardene gtt in ED on 3/13.    Interim history/subjective:   Headache is improved, took ibuprofen overnight Complains of mild chest pain right side and on the breast and radiating to back  Objective   Blood pressure (!) 141/83, pulse 61, temperature 98.4 F (36.9 C), temperature source Oral, resp. rate 18, height 5\' 4"  (1.626 m), weight 95.2 kg, SpO2 96 %.        Intake/Output Summary (Last 24 hours) at 05/05/2019 0912 Last data filed at 05/05/2019 [id number] Gross per 24 hour  Intake 100 ml  Output 0 ml  Net 100 ml   Filed Weights   05/04/19 0328 05/04/19 1346 05/04/19 2059  Weight: 99.8 kg 95.2 kg 95.2 kg    Examination: General:  Elderly woman, no distress, able to ambulate HENT: mmm Lungs: clear no wheezes or crackles Cardiovascular: RRR no mrg, mild tenderness right musculoskeletal Abdomen: soft, non tender, no organomegaly Extremities: no cyanosis, no clubbing no edema Neuro: AO x 4. No focal deficits.   CT negative CT angiogram chest-no evidence of dissection Echo shows mild LVH, grade 1 diastolic dysfunction  Assessment & Plan:  Hypertensive Urgency -no target organ damage - improved, off cardene drip - resumed home medications losartan and hctz -Headache is now resolved and no evidence of dissection, no other cause of chest pain identified.  She does report that chest pain has been ongoing for "a long time" -UDS positive for opiates and benzodiazepines-which may be related to tramadol and Xanax, she does admit to using marijuana  Mild AKI - resolved, UA nml  Tobacco and MJ use disorder -Mild emphysema on CT, no evidence of exacerbation - Prn nebs -Smoking cessation emphasized.   Okay to discharge  today    Labs   CBC: Recent Labs  Lab 05/03/19 1145 05/04/19 0332 05/05/19 0339  WBC 3.9* 5.8 5.0  NEUTROABS 3.1 4.0 3.1  HGB 13.9 14.8 13.9  HCT 44.6 45.1 42.7  MCV 83.8 80.4 80.4  PLT 158 143* [id number]    Basic Metabolic Panel: Recent Labs  Lab 05/03/19 1145 05/04/19 0330 05/04/19 0332 05/05/19 0339  NA 135 135  --   --   K 3.5 3.9  --   --   CL 99 99  --   --   CO2 22 24  --   --   GLUCOSE 158* 94  --   --   BUN 12 8  --   --   CREATININE 1.04* 0.94  --   --   CALCIUM 9.2 9.4  --   --   MG 1.9  --  2.3 1.9  PHOS  --   --  2.8 2.5   GFR: Estimated Creatinine Clearance: 67.7 mL/min (by C-G formula based on SCr of 0.94 mg/dL). Recent Labs  Lab 05/03/19 1145 05/03/19 1241 05/04/19 0332 05/05/19 0339  WBC 3.9*  --  5.8 5.0  LATICACIDVEN  --  1.7  --   --     Liver Function Tests: Recent Labs  Lab 05/03/19 1145  AST 21  ALT 30  ALKPHOS 75  BILITOT 1.0  PROT 7.4  ALBUMIN 4.0   Recent Labs  Lab 05/03/19 1145  LIPASE 15   No results for input(s): AMMONIA  in the last 168 hours.  ABG    Component Value Date/Time   TCO2 23 10/04/2017 0056      Kara Mead MD. FCCP. [facility]  If no response to pager , please call [phone]   05/05/2019

## 2019-05-05 NOTE — Plan of Care (Signed)

## 2019-05-06 ENCOUNTER — Telehealth: Payer: Self-pay | Admitting: *Deleted

## 2019-05-06 NOTE — Telephone Encounter (Signed)
Error. Spoke with patient. Patient reports she now goes to a doctor at [facility].

## 2019-05-30 ENCOUNTER — Telehealth (HOSPITAL_COMMUNITY): Payer: Self-pay | Admitting: Clinical

## 2019-05-30 ENCOUNTER — Ambulatory Visit (HOSPITAL_COMMUNITY): Payer: Medicare Other | Admitting: Clinical

## 2019-05-30 ENCOUNTER — Other Ambulatory Visit: Payer: Self-pay

## 2019-05-30 NOTE — Telephone Encounter (Signed)
The OPTtherapist attempted text to session with given number x2 @ 10:00AM and 10:08AM, however, the patient did not respond and missed their scheduled session.

## 2020-05-09 ENCOUNTER — Encounter (HOSPITAL_COMMUNITY): Payer: Self-pay

## 2020-05-09 ENCOUNTER — Other Ambulatory Visit: Payer: Self-pay

## 2020-05-09 ENCOUNTER — Emergency Department (HOSPITAL_COMMUNITY)
Admission: EM | Admit: 2020-05-09 | Discharge: 2020-05-09 | Disposition: A | Discharge status: Home / Self Care | Payer: Medicare Other | Attending: Emergency Medicine | Admitting: Emergency Medicine

## 2020-05-09 DIAGNOSIS — I10 Essential (primary) hypertension: Secondary | ICD-10-CM | POA: Diagnosis not present

## 2020-05-09 DIAGNOSIS — K625 Hemorrhage of anus and rectum: Secondary | ICD-10-CM | POA: Diagnosis not present

## 2020-05-09 DIAGNOSIS — Z7951 Long term (current) use of inhaled steroids: Secondary | ICD-10-CM | POA: Insufficient documentation

## 2020-05-09 DIAGNOSIS — J449 Chronic obstructive pulmonary disease, unspecified: Secondary | ICD-10-CM | POA: Insufficient documentation

## 2020-05-09 DIAGNOSIS — F17210 Nicotine dependence, cigarettes, uncomplicated: Secondary | ICD-10-CM | POA: Insufficient documentation

## 2020-05-09 DIAGNOSIS — Z79899 Other long term (current) drug therapy: Secondary | ICD-10-CM | POA: Diagnosis not present

## 2020-05-09 NOTE — ED Notes (Signed)
Pt speaking on phone throughout entire triage.

## 2020-05-09 NOTE — ED Provider Notes (Signed)
[facility] EMERGENCY DEPARTMENT Provider Note   CSN: [id number] Arrival date & time: 05/09/20  2355     History Chief Complaint  Patient presents with  . Rectal Bleeding    Jill Newton is a 65 y.o. female.  HPI 65 year old female history of hypertension, hyperlipidemia, COPD, peptic ulcers presents today with concern for rectal bleeding.  She states that she used an herb recently.  After using the herb, she has had a poor appetite and has not had very good bowel movements for the past several days.  She felt that she was constipated.  She felt that there was possibly a ball of stool in the rectum.  She attempted to extricate the ball stool.  At that time she noted some bleeding onto her finger that she did used to extricate the stool.  She presents today due to concerns about rectal bleeding.  She is not anticoagulated.  She is a history of gastritis but denies past medical history significant rectal bleeding.  She is not on any anticoagulation.  She is not having significant pain at this time.  She has been able to eat and drink but has just not been eating and drinking as usual.  She uses linzess for constipation    Past Medical History:  Diagnosis Date  . Anxiety   . Chest pain   . COPD (chronic obstructive pulmonary disease) (HCC)    Wheezing, SOB  . Depression   . Dyspnea    When going up inclines  . Edema    In ankles. Since going off meds  . Enlarged heart   . GERD (gastroesophageal reflux disease)   . Hyperlipidemia   . Hypertension   . Migraine   . Palpitations   . Peptic ulcer     Patient Active Problem List   Diagnosis Date Noted  . Lightheadedness   . Hypertensive emergency 05/03/2019  . Mixed hyperlipidemia 08/09/2017  . Anxiety 08/09/2017  . Cigarette nicotine dependence without complication 73/22/0254  . Dysphagia   . Atypical chest pain   . Cigarette smoker 06/03/2016  . COPD  GOLD 0 active smoker   . Essential  hypertension   . Enlarged heart   . GERD (gastroesophageal reflux disease)   . Chest pain     Past Surgical History:  Procedure Laterality Date  . [place] STUDY N/A 06/20/2017   Procedure: [place] STUDY;  Surgeon: Yetta Flock, MD;  Location: [facility] ENDOSCOPY;  Service: Gastroenterology;  Laterality: N/A;  . CARDIAC CATHETERIZATION    . ESOPHAGEAL MANOMETRY N/A 06/20/2017   Procedure: ESOPHAGEAL MANOMETRY (EM);  Surgeon: Yetta Flock, MD;  Location: [facility] ENDOSCOPY;  Service: Gastroenterology;  Laterality: N/A;  . TUBAL LIGATION       OB History   No obstetric history on file.     Family History  Problem Relation Age of Onset  . Congestive Heart Failure Mother   . Diabetes Mother   . Hypertension Mother   . Stroke Father   . Cancer Brother        type unknown  . Heart disease Sister   . Bone cancer Maternal Aunt   . Cancer Maternal Uncle        type unknown  . Diabetes Sister     Social History   Tobacco Use  . Smoking status: Current Every Day Smoker    Packs/day: 0.50    Years: 46.00    Pack years: 23.00  Types: Cigarettes  . Smokeless tobacco: Never Used  Substance Use Topics  . Alcohol use: No  . Drug use: Yes    Types: Marijuana    Home Medications Prior to Admission medications   Medication Sig Start Date End Date Taking? Authorizing Provider  albuterol (PROVENTIL HFA;VENTOLIN HFA) 108 (90 Base) MCG/ACT inhaler Inhale 1-2 puffs into the lungs every 6 (six) hours as needed for wheezing or shortness of breath. 10/04/17   Palumbo, April, MD  ALPRAZolam Duanne Moron) 1 MG tablet Take 1 mg by mouth 3 (three) times daily. 04/13/19   [provider]  atorvastatin (LIPITOR) 10 MG tablet TAKE 1 TABLET(10 MG) BY MOUTH DAILY Patient taking differently: Take 10 mg by mouth daily at 6 PM.  12/28/17   Billie Ruddy, MD  gabapentin (NEURONTIN) 300 MG capsule TAKE 1 CAPSULE BY MOUTH TWICE DAILY. START OFF TAKING 1 AT BEDTIME FOR 1 WEEK AND MAY INCREASE  TO 2 TIMES DAILY Patient not taking: Reported on 05/04/2019 06/03/18   Billie Ruddy, MD  hydrOXYzine (ATARAX/VISTARIL) 50 MG tablet Take 100 mg by mouth at bedtime. 03/07/19   [provider]  linaclotide (LINZESS) 72 MCG capsule Take 72 mcg by mouth daily as needed (constipation).     [provider]  losartan-hydrochlorothiazide (HYZAAR) 100-25 MG tablet Take 1 tablet by mouth daily. 04/30/18   Billie Ruddy, MD  omeprazole (PRILOSEC) 20 MG capsule Take 1 capsule (20 mg total) by mouth daily. Patient not taking: Reported on 05/04/2019 10/04/17   Palumbo, April, MD  sucralfate (CARAFATE) 1 g tablet Take 1 g by mouth 2 (two) times daily.  03/19/19   [provider]  SYMBICORT 160-4.5 MCG/ACT inhaler Inhale 2 puffs into the lungs in the morning and at bedtime. 02/11/19   [provider]  traMADol (ULTRAM) 50 MG tablet Take 100 mg by mouth 3 (three) times daily. 04/15/19   [provider]  venlafaxine XR (EFFEXOR-XR) 75 MG 24 hr capsule Take 75 mg by mouth daily. 01/09/19   [provider]    Allergies    Codeine  Review of Systems   Review of Systems  All other systems reviewed and are negative.   Physical Exam Updated Vital Signs BP 117/76   Pulse 91   Temp 98.3 F (36.8 C) (Oral)   Resp 16   Ht 1.626 m (5\' 4" )   Wt 95.2 kg   SpO2 100%   BMI 36.03 kg/m   Physical Exam Vitals and nursing note reviewed.  Constitutional:      General: She is not in acute distress.    Appearance: Normal appearance. She is not ill-appearing.  HENT:     Head: Normocephalic.     Right Ear: External ear normal.     Left Ear: External ear normal.     Nose: Nose normal.     Mouth/Throat:     Pharynx: Oropharynx is clear.  Eyes:     Conjunctiva/sclera: Conjunctivae normal.     Pupils: Pupils are equal, round, and reactive to light.  Cardiovascular:     Rate and Rhythm: Normal rate and regular rhythm.     Pulses: Normal pulses.  Pulmonary:      Effort: Pulmonary effort is normal.     Breath sounds: Normal breath sounds.  Abdominal:     General: There is no distension.     Palpations: Abdomen is soft.     Tenderness: There is no abdominal tenderness.  Genitourinary:  Comments: Rectal exam and There is an area of bleeding at 12:00 that is nontender Digital exam reveals no evidence of stool or bleeding above the rectal verge Musculoskeletal:        General: Normal range of motion.     Cervical back: Normal range of motion.  Skin:    General: Skin is warm.     Capillary Refill: Capillary refill takes less than 2 seconds.  Neurological:     General: No focal deficit present.     Mental Status: She is alert.  Psychiatric:        Mood and Affect: Mood normal.     ED Results / Procedures / Treatments   Labs (all labs ordered are listed, but only abnormal results are displayed) Labs Reviewed - No data to display  EKG None  Radiology No results found.  Procedures Procedures   Medications Ordered in ED Medications - No data to display  ED Course  I have reviewed the triage vital signs and the nursing notes.  Pertinent labs & imaging results that were available during my care of the patient were reviewed by me and considered in my medical decision making (see chart for details).    MDM Rules/Calculators/A&P                          Discussed symptomatic treatment and stool bulking agents.  Patient has follow-up appointment with her doctor on Tuesday.  She is advised regarding return precautions and need for follow-up and voiced understanding Final Clinical Impression(s) / ED Diagnoses Final diagnoses:  Rectal bleeding    Rx / DC Orders ED Discharge Orders    None       Pattricia Boss, MD 05/09/20 [phone]

## 2020-05-09 NOTE — ED Notes (Signed)
Pt d/c home per MD order. Discharge summary reviewed with pt, pt verbalizes understanding. No s/s of acute distress noted at discharge. Ambulatory. Discharged home with friend.

## 2020-05-09 NOTE — ED Triage Notes (Addendum)
Patient reports bleeding from rectum, states she has been constipated x 3 days, she placed her finger up her rectum to try to pull the stool out and she noticed blood on her finger, denies any blood on the toilet tissue. Reports her stool had been hard pebbles. Does report she has a hx of hemorrhoids.

## 2020-10-07 ENCOUNTER — Other Ambulatory Visit: Payer: Self-pay | Admitting: Student

## 2020-10-07 DIAGNOSIS — Z780 Asymptomatic menopausal state: Secondary | ICD-10-CM

## 2021-01-01 ENCOUNTER — Emergency Department (HOSPITAL_COMMUNITY)
Admission: EM | Admit: 2021-01-01 | Discharge: 2021-01-02 | Disposition: A | Discharge status: Home / Self Care | Payer: Medicare Other | Attending: Emergency Medicine | Admitting: Emergency Medicine

## 2021-01-01 ENCOUNTER — Encounter (HOSPITAL_COMMUNITY): Payer: Self-pay

## 2021-01-01 ENCOUNTER — Emergency Department (HOSPITAL_COMMUNITY): Payer: Medicare Other

## 2021-01-01 ENCOUNTER — Other Ambulatory Visit: Payer: Self-pay

## 2021-01-01 DIAGNOSIS — Z79899 Other long term (current) drug therapy: Secondary | ICD-10-CM | POA: Insufficient documentation

## 2021-01-01 DIAGNOSIS — F17210 Nicotine dependence, cigarettes, uncomplicated: Secondary | ICD-10-CM | POA: Diagnosis not present

## 2021-01-01 DIAGNOSIS — Z20822 Contact with and (suspected) exposure to covid-19: Secondary | ICD-10-CM | POA: Diagnosis not present

## 2021-01-01 DIAGNOSIS — J101 Influenza due to other identified influenza virus with other respiratory manifestations: Secondary | ICD-10-CM | POA: Insufficient documentation

## 2021-01-01 DIAGNOSIS — Z7951 Long term (current) use of inhaled steroids: Secondary | ICD-10-CM | POA: Insufficient documentation

## 2021-01-01 DIAGNOSIS — I10 Essential (primary) hypertension: Secondary | ICD-10-CM | POA: Diagnosis not present

## 2021-01-01 DIAGNOSIS — J449 Chronic obstructive pulmonary disease, unspecified: Secondary | ICD-10-CM | POA: Diagnosis not present

## 2021-01-01 DIAGNOSIS — R519 Headache, unspecified: Secondary | ICD-10-CM | POA: Diagnosis present

## 2021-01-01 LAB — RESP PANEL BY RT-PCR (FLU A&B, COVID) ARPGX2
Influenza A by PCR: POSITIVE — AB
Influenza B by PCR: NEGATIVE
SARS Coronavirus 2 by RT PCR: NEGATIVE

## 2021-01-01 MED ORDER — BENZONATATE 100 MG PO CAPS: 100.0000 mg | ORAL_CAPSULE | Freq: Three times a day (TID) | ORAL | 0 refills | Status: DC

## 2021-01-01 MED ORDER — OSELTAMIVIR PHOSPHATE 75 MG PO CAPS: 75.0000 mg | ORAL_CAPSULE | Freq: Two times a day (BID) | ORAL | 0 refills | Status: DC

## 2021-01-01 MED ORDER — BENZONATATE 100 MG PO CAPS
200.0000 mg | ORAL_CAPSULE | Freq: Once | ORAL | Status: AC
  Administered 2021-01-02: 200 mg via ORAL
  Filled 2021-01-01: qty 2

## 2021-01-01 NOTE — Discharge Instructions (Addendum)
You tested positive for flu today.  I have prescribed you Tamiflu and Tessalon Perles which should help with your symptoms.  Please continue to use your albuterol nebulizers at home.  Please return to the emergency department if you are experiencing worsening and severe cough, trouble breathing, trouble talking, chest pain or tightness, or any other concerns you might have.  Please follow-up with your primary care provider within the next week to ensure we are going in the right direction.

## 2021-01-01 NOTE — ED Provider Notes (Signed)
[facility] DEPT Provider Note   CSN: [id number] Arrival date & time: 01/01/21  2047     History Chief Complaint  Patient presents with   Nasal Congestion   Headache   Cough    Jill Newton is a 65 y.o. female with history of COPD presents to the emergency department with a 1 day history of cough, myalgias, and general malaise.  She took some lemon and honey mixture yesterday which briefly improved her symptoms.  Her cough is nonproductive and constant.  She denies any nausea, vomiting, abdominal pain, chest pain, shortness of breath.  She has been using her albuterol nebulizers daily which seemed to help yesterday.  No sick contacts.  She rates her cough severe in severity.   Headache Associated symptoms: cough   Cough Associated symptoms: headaches       Past Medical History:  Diagnosis Date   Anxiety    Chest pain    COPD (chronic obstructive pulmonary disease) (HCC)    Wheezing, SOB   Depression    Dyspnea    When going up inclines   Edema    In ankles. Since going off meds   Enlarged heart    GERD (gastroesophageal reflux disease)    Hyperlipidemia    Hypertension    Migraine    Palpitations    Peptic ulcer     Patient Active Problem List   Diagnosis Date Noted   Lightheadedness    Hypertensive emergency 05/03/2019   Mixed hyperlipidemia 08/09/2017   Anxiety 08/09/2017   Cigarette nicotine dependence without complication 38/46/6599   Dysphagia    Atypical chest pain    Cigarette smoker 06/03/2016   COPD  GOLD 0 active smoker    Essential hypertension    Enlarged heart    GERD (gastroesophageal reflux disease)    Chest pain     Past Surgical History:  Procedure Laterality Date   58 HOUR [place] STUDY N/A 06/20/2017   Procedure: 24 HOUR PH STUDY;  Surgeon: Yetta Flock, MD;  Location: [facility];  Service: Gastroenterology;  Laterality: N/A;   CARDIAC CATHETERIZATION     ESOPHAGEAL MANOMETRY N/A  06/20/2017   Procedure: ESOPHAGEAL MANOMETRY (EM);  Surgeon: Yetta Flock, MD;  Location: [facility];  Service: Gastroenterology;  Laterality: N/A;   TUBAL LIGATION       OB History   No obstetric history on file.     Family History  Problem Relation Age of Onset   Congestive Heart Failure Mother    Diabetes Mother    Hypertension Mother    Stroke Father    Cancer Brother        type unknown   Heart disease Sister    Bone cancer Maternal Aunt    Cancer Maternal Uncle        type unknown   Diabetes Sister     Social History   Tobacco Use   Smoking status: Every Day    Packs/day: 0.50    Years: 46.00    Pack years: 23.00    Types: Cigarettes   Smokeless tobacco: Never  Substance Use Topics   Alcohol use: No   Drug use: Yes    Types: Marijuana    Home Medications Prior to Admission medications   Medication Sig Start Date End Date Taking? Authorizing Provider  benzonatate (TESSALON) 100 MG capsule Take 1 capsule (100 mg total) by mouth every 8 (eight) hours. 01/01/21  Yes Hendricks Limes, PA-C  oseltamivir (TAMIFLU) 75 MG capsule Take 1 capsule (75 mg total) by mouth every 12 (twelve) hours. 01/01/21  Yes Raul Del, Rocky Rishel M, PA-C  albuterol (PROVENTIL HFA;VENTOLIN HFA) 108 (90 Base) MCG/ACT inhaler Inhale 1-2 puffs into the lungs every 6 (six) hours as needed for wheezing or shortness of breath. 10/04/17   Palumbo, April, MD  ALPRAZolam Duanne Moron) 1 MG tablet Take 1 mg by mouth 3 (three) times daily. 04/13/19   [provider]  atorvastatin (LIPITOR) 10 MG tablet TAKE 1 TABLET(10 MG) BY MOUTH DAILY Patient taking differently: Take 10 mg by mouth daily at 6 PM.  12/28/17   Billie Ruddy, MD  gabapentin (NEURONTIN) 300 MG capsule TAKE 1 CAPSULE BY MOUTH TWICE DAILY. START OFF TAKING 1 AT BEDTIME FOR 1 WEEK AND MAY INCREASE TO 2 TIMES DAILY Patient not taking: Reported on 05/04/2019 06/03/18   Billie Ruddy, MD  hydrOXYzine (ATARAX/VISTARIL) 50 MG tablet  Take 100 mg by mouth at bedtime. 03/07/19   [provider]  linaclotide (LINZESS) 72 MCG capsule Take 72 mcg by mouth daily as needed (constipation).     [provider]  losartan-hydrochlorothiazide (HYZAAR) 100-25 MG tablet Take 1 tablet by mouth daily. 04/30/18   Billie Ruddy, MD  omeprazole (PRILOSEC) 20 MG capsule Take 1 capsule (20 mg total) by mouth daily. Patient not taking: Reported on 05/04/2019 10/04/17   Palumbo, April, MD  sucralfate (CARAFATE) 1 g tablet Take 1 g by mouth 2 (two) times daily.  03/19/19   [provider]  SYMBICORT 160-4.5 MCG/ACT inhaler Inhale 2 puffs into the lungs in the morning and at bedtime. 02/11/19   [provider]  traMADol (ULTRAM) 50 MG tablet Take 100 mg by mouth 3 (three) times daily. 04/15/19   [provider]  venlafaxine XR (EFFEXOR-XR) 75 MG 24 hr capsule Take 75 mg by mouth daily. 01/09/19   [provider]    Allergies    Codeine  Review of Systems   Review of Systems  Respiratory:  Positive for cough.   Neurological:  Positive for headaches.  All other systems reviewed and are negative.  Physical Exam Updated Vital Signs BP (!) 168/86   Pulse 79   Temp 98.4 F (36.9 C) (Oral)   Resp 20   SpO2 100%   Physical Exam Vitals and nursing note reviewed.  Constitutional:      General: She is not in acute distress.    Appearance: Normal appearance.  HENT:     Head: Normocephalic and atraumatic.  Eyes:     General:        Right eye: No discharge.        Left eye: No discharge.  Cardiovascular:     Comments: Regular rate and rhythm.  S1/S2 are distinct without any evidence of murmur, rubs, or gallops.  Radial pulses are 2+ bilaterally.  Dorsalis pedis pulses are 2+ bilaterally.  No evidence of pedal edema. Pulmonary:     Comments: Coughing on exam. Clear to auscultation bilaterally.  Normal effort.  No respiratory distress.  No evidence of wheezes, rales, or rhonchi heard  throughout. Abdominal:     General: Abdomen is flat. Bowel sounds are normal. There is no distension.     Tenderness: There is no abdominal tenderness. There is no guarding or rebound.  Musculoskeletal:        General: Normal range of motion.     Cervical back: Neck supple.  Skin:    General: Skin is  warm and dry.     Findings: No rash.  Neurological:     General: No focal deficit present.     Mental Status: She is alert.  Psychiatric:        Mood and Affect: Mood normal.        Behavior: Behavior normal.    ED Results / Procedures / Treatments   Labs (all labs ordered are listed, but only abnormal results are displayed) Labs Reviewed  RESP PANEL BY RT-PCR (FLU A&B, COVID) ARPGX2 - Abnormal; Notable for the following components:      Result Value   Influenza A by PCR POSITIVE (*)    All other components within normal limits    EKG None  Radiology DG Chest 2 View  Result Date: 01/01/2021 CLINICAL DATA:  Cough and congestion. EXAM: CHEST - 2 VIEW COMPARISON:  Radiograph and chest CT 05/03/2019. FINDINGS: Normal heart size and mediastinal contours. Mild emphysema with bronchial thickening. No focal airspace disease. No pulmonary edema. No pleural effusion or pneumothorax. No acute osseous abnormalities are seen. IMPRESSION: Mild emphysema and bronchial thickening.  No focal airspace disease. Electronically Signed   By: Keith Rake M.D.   On: 01/01/2021 21:27    Procedures Procedures   Medications Ordered in ED Medications  benzonatate (TESSALON) capsule 200 mg (has no administration in time range)    ED Course  I have reviewed the triage vital signs and the nursing notes.  Pertinent labs & imaging results that were available during my care of the patient were reviewed by me and considered in my medical decision making (see chart for details).    MDM Rules/Calculators/A&P                          Jill Newton is a 66 y.o. female with history of COPD  who presents to the emergency department for for evaluation of flulike symptoms.  Patient is in no respiratory distress at this time although she was coughing on exam.  She tested positive for influenza A.  Chest x-ray did not reveal any signs of pneumonia.  Given her comorbidities and clinical course, I believe she would benefit from Tamiflu.  I will also prescribe her Tessalon Perles for cough.  I instructed her to continue using her albuterol treatments daily.  Strict return precautions were given.  I will have her follow-up with her primary care doctor for further evaluation. She is safe for discharge.   Final Clinical Impression(s) / ED Diagnoses Final diagnoses:  Influenza A    Rx / DC Orders ED Discharge Orders          Ordered    oseltamivir (TAMIFLU) 75 MG capsule  Every 12 hours        01/01/21 2353    benzonatate (TESSALON) 100 MG capsule  Every 8 hours        01/01/21 2353             Hendricks Limes, PA-C 01/02/21 0016    Daleen Bo, MD 01/02/21 [phone]

## 2021-01-01 NOTE — ED Triage Notes (Signed)
Pt reports with cough, congestion, and headache x 2 days. Pt states that her bronchitis is flaring up.

## 2021-01-01 NOTE — ED Provider Notes (Signed)
Emergency Medicine Provider Triage Evaluation Note  Jill Newton , a 65 y.o. female  was evaluated in triage.  Pt complains of cough. She states that her cough is nonproductive and has been ongoing for the past 2 days. She endorses a history of bronchitis with flares in the past and states that this feels the same. She says that it has been several years since her last flare. She has been trying to manage her symptoms with her home albuterol and over the counter cold medicine without relief.  Review of Systems  Positive: Cough, congestion Negative: Fevers, chills  Physical Exam  BP (!) 168/86   Pulse 79   Temp 98.4 F (36.9 C) (Oral)   Resp 20   SpO2 100%  Gen:   Awake, no distress   Resp:  Normal effort, able to speak in complete sentences without difficulty. Noted nonproductive cough present MSK:   Moves extremities without difficulty  Other:    Medical Decision Making  Medically screening exam initiated at 9:14 PM.  Appropriate orders placed.  Jill Newton was informed that the remainder of the evaluation will be completed by another provider, this initial triage assessment does not replace that evaluation, and the importance of remaining in the ED until their evaluation is complete.    Nestor Lewandowsky 01/01/21 2120    Tegeler, Gwenyth Allegra, MD 01/02/21 [phone]

## 2021-01-21 ENCOUNTER — Other Ambulatory Visit: Payer: Self-pay | Admitting: Family

## 2021-01-21 DIAGNOSIS — R918 Other nonspecific abnormal finding of lung field: Secondary | ICD-10-CM

## 2021-02-25 ENCOUNTER — Ambulatory Visit
Admission: RE | Admit: 2021-02-25 | Discharge: 2021-02-25 | Disposition: A | Discharge status: Home / Self Care | Payer: Medicare Other | Source: Ambulatory Visit | Attending: Family Medicine | Admitting: Family Medicine

## 2021-02-25 ENCOUNTER — Other Ambulatory Visit: Payer: Self-pay | Admitting: Family Medicine

## 2021-02-25 DIAGNOSIS — M79675 Pain in left toe(s): Secondary | ICD-10-CM

## 2021-03-08 ENCOUNTER — Ambulatory Visit
Admission: RE | Admit: 2021-03-08 | Discharge: 2021-03-08 | Disposition: A | Discharge status: Home / Self Care | Payer: Medicare Other | Source: Ambulatory Visit | Attending: Family | Admitting: Family

## 2021-03-08 DIAGNOSIS — R918 Other nonspecific abnormal finding of lung field: Secondary | ICD-10-CM

## 2021-04-15 ENCOUNTER — Telehealth: Payer: Self-pay

## 2021-04-15 NOTE — Telephone Encounter (Signed)
NOTES SCANNED TO REFERRAL 

## 2021-05-02 ENCOUNTER — Encounter: Payer: Self-pay | Admitting: *Deleted

## 2021-06-05 NOTE — Progress Notes (Signed)
?Cardiology Office Note:   ? ?Date:  06/06/2021  ? ?ID:  Jill Newton, DOB 1955-06-16, MRN [id number] ? ?PCP:  Sonia Side., FNP  ?Cardiologist:  None  ? ?Referring MD: Sonia Side., FNP  ? ?No chief complaint on file. ? ? ?History of Present Illness:   ? ?Jill Newton is a 65 y.o. female with a hx of hypertension and heart failure referred for cardiology follow-up and treatment by Dustin Folks, NP at [facility].. ? ? ?She has a continuous tightness in her chest.  She continues to concentrate on a "nodule in the right lung".  She tells me a heart catheterization was performed recently from her left wrist.  Epic identifies coronary angiography being performed in 2012 without obstructive disease being present. ? ?She smokes cigarettes.  She smokes weed.  She has hypertension and is compliant with medication although instead of taking her typical dose of Hyzaar she took 1/2 tablet today.  She has been told she has an enlarged heart.  Her last cardiology was at [facility] and she saw Dr. Brigitte Pulse as her cardiologist. ? ?Cannabis dependence, generalized anxiety disorder, COPD, "enlarged heart", palpitations, dyspnea, as some of the problems listed on her medical records from [facility]. ? ?Past Medical History:  ?Diagnosis Date  ? Anxiety   ? Chest pain   ? COPD (chronic obstructive pulmonary disease) ([place])   ? Wheezing, SOB  ? Depression   ? Dyspnea   ? When going up inclines  ? Edema   ? In ankles. Since going off meds  ? Enlarged heart   ? GERD (gastroesophageal reflux disease)   ? Hyperlipidemia   ? Hypertension   ? Migraine   ? Palpitations   ? Peptic ulcer   ? ? ?Past Surgical History:  ?Procedure Laterality Date  ? 26 HOUR [place] STUDY N/A 06/20/2017  ? Procedure: [place];  Surgeon: Yetta Flock, MD;  Location: [facility];  Service: Gastroenterology;  Laterality: N/A;  ? CARDIAC CATHETERIZATION    ? ESOPHAGEAL MANOMETRY N/A 06/20/2017  ? Procedure: ESOPHAGEAL  MANOMETRY (EM);  Surgeon: Yetta Flock, MD;  Location: [facility];  Service: Gastroenterology;  Laterality: N/A;  ? TUBAL LIGATION    ? ? ?Current Medications: ?Current Meds  ?Medication Sig  ? albuterol (PROVENTIL HFA;VENTOLIN HFA) 108 (90 Base) MCG/ACT inhaler Inhale 1-2 puffs into the lungs every 6 (six) hours as needed for wheezing or shortness of breath.  ? ALPRAZolam (XANAX) 1 MG tablet Take 1 mg by mouth 3 (three) times daily.  ? atorvastatin (LIPITOR) 10 MG tablet TAKE 1 TABLET(10 MG) BY MOUTH DAILY (Patient taking differently: Take 10 mg by mouth daily at 6 PM.)  ? famotidine (PEPCID) 20 MG tablet Take 20 mg by mouth daily as needed for heartburn or indigestion.  ? losartan-hydrochlorothiazide (HYZAAR) 100-25 MG tablet Take 1 tablet by mouth daily.  ? metoprolol tartrate (LOPRESSOR) 100 MG tablet Take one tablet by mouth 2 hours prior to CT  ? Oxycodone HCl 10 MG TABS Take 10 mg by mouth 3 (three) times daily as needed.  ? Peppermint Oil 50 MG CPDR Take by mouth.  ? psyllium (REGULOID) 0.52 g capsule Take 0.52 g by mouth daily.  ? SYMBICORT 160-4.5 MCG/ACT inhaler Inhale 2 puffs into the lungs in the morning and at bedtime.  ? zolpidem (AMBIEN) 10 MG tablet Take 10 mg by mouth at bedtime as needed for sleep.  ?  ? ?  Allergies:   Codeine  ? ?Social History  ? ?Socioeconomic History  ? Marital status: Widowed  ?  Spouse name: Not on file  ? Number of children: 3  ? Years of education: Not on file  ? Highest education level: Not on file  ?Occupational History  ? Not on file  ?Tobacco Use  ? Smoking status: Every Day  ?  Packs/day: 0.50  ?  Years: 46.00  ?  Pack years: 23.00  ?  Types: Cigarettes  ? Smokeless tobacco: Never  ?Substance and Sexual Activity  ? Alcohol use: No  ? Drug use: Yes  ?  Types: Marijuana  ? Sexual activity: Yes  ?Other Topics Concern  ? Not on file  ?Social History Narrative  ? Not on file  ? ?Social Determinants of Health  ? ?Financial Resource Strain: Not on file  ?Food  Insecurity: Not on file  ?Transportation Needs: Not on file  ?Physical Activity: Not on file  ?Stress: Not on file  ?Social Connections: Not on file  ?  ? ?Family History: ?The patient's family history includes Bone cancer in her maternal aunt; Cancer in her brother and maternal uncle; Congestive Heart Failure in her mother; Diabetes in her mother and sister; Heart disease in her sister; Hypertension in her mother; Stroke in her father. ? ?ROS:   ?Please see the history of present illness.    ?History of reflux and hiatal hernia for which an acid therapy has been recommended.  Chest discomfort has been attributed to that problem.  All other systems reviewed and are negative. ? ?EKGs/Labs/Other Studies Reviewed:   ? ?The following studies were reviewed today: ? ?Coronary angiography 2012: ?IMPRESSION:  The patient has no significant coronary artery disease. ?Her chest pain would appear to be noncardiac in etiology.  She tolerated ?the procedure well.  At the end of the procedure, a TR band was placed ?with good hemostasis. ? ? ?2 D Doppler ECHOCARDIOGRAM 05/04/2019: ?IMPRESSIONS  ? ? ? 1. Left ventricular ejection fraction by 3D volume is 68 %. The left  ?ventricle has normal function. The left ventricle has no regional wall  ?motion abnormalities. There is mild left ventricular hypertrophy. Left  ?ventricular diastolic parameters are  ?consistent with Grade I diastolic dysfunction (impaired relaxation).  ? 2. Right ventricular systolic function is normal. The right ventricular  ?size is normal. There is borderline elevated pulmonary artery systolic  ?pressure. The estimated right ventricular systolic pressure is 39.7 mmHg.  ? 3. The mitral valve is normal in structure. No evidence of mitral valve  ?regurgitation. No evidence of mitral stenosis.  ? 4. The aortic valve is normal in structure. Aortic valve regurgitation is  ?mild. No aortic stenosis is present.  ? 5. The inferior vena cava is normal in size with greater  than 50%  ?respiratory variability, suggesting right atrial pressure of 3 mmHg.  ? ? ?CT angio chest 04/2019: ?IMPRESSION: ?1. Normal contour and caliber of the thoracic and abdominal aorta. ?No evidence of aortic aneurysm, dissection or other acute aortic ?pathology. Minimal atherosclerosis of the infrarenal abdominal ?aorta. Aortic Atherosclerosis (ICD10-I70.0). ?2. Bronchial wall thickening and plugging in the right lower lobe ?with minimal associated atelectasis or consolidation of the ?dependent right lung, findings suggestive of aspiration. ?3. Emphysema (ICD10-J43.9). ?  ? ? ?EKG:  EKG normal sinus rhythm, with diffuse TWA.  Tracing performed on 06/06/2021 demonstrates normal sinus rhythm, right atrial abnormality, nonspecific T wave abnormality. ? ?Recent Labs: ?No results found for requested labs  within last 8760 hours.  ?Recent Lipid Panel ?   ?Component Value Date/Time  ? CHOL (H) 06/02/2010 0434  ?  236        ?ATP III CLASSIFICATION: ? <200     mg/dL   Desirable ? 200-239  mg/dL   Borderline High ? >=240    mg/dL   High ?        ? TRIG 75 06/02/2010 0434  ? HDL 72 06/02/2010 0434  ? CHOLHDL 3.3 06/02/2010 0434  ? VLDL 15 06/02/2010 0434  ? [place] (H) 06/02/2010 0434  ?  149        ?Total Cholesterol/HDL:CHD Risk ?Coronary Heart Disease Risk Table ?                    Men   Women ? 1/2 Average Risk   3.4   3.3 ? Average Risk       5.0   4.4 ? 2 X Average Risk   9.6   7.1 ? 3 X Average Risk  23.4   11.0 ?       ?Use the calculated Patient Ratio ?above and the CHD Risk Table ?to determine the patient's CHD Risk. ?       ?ATP III CLASSIFICATION (LDL): ? <100     mg/dL   Optimal ? 100-129  mg/dL   Near or Above ?                   Optimal ? 130-159  mg/dL   Borderline ? 160-189  mg/dL   High ? >190     mg/dL   Very High  ? ? ?Physical Exam:   ? ?VS:  BP (!) 132/92   Pulse 72   Ht '5\' 4"'$  (1.626 m)   Wt 192 lb (87.1 kg)   SpO2 99%   BMI 32.96 kg/m?    ? ?Wt Readings from Last 3 Encounters:  ?06/06/21 192 lb  (87.1 kg)  ?05/09/20 209 lb 14.1 oz (95.2 kg)  ?05/04/19 209 lb 14.1 oz (95.2 kg)  ?  ? ?GEN: Overweight. No acute distress ?HEENT: Normal ?NECK: No JVD. ?LYMPHATICS: No lymphadenopathy ?CARDIAC: No murmur. RRR S4

## 2021-06-06 ENCOUNTER — Ambulatory Visit (INDEPENDENT_AMBULATORY_CARE_PROVIDER_SITE_OTHER): Payer: Medicare Other | Admitting: Interventional Cardiology

## 2021-06-06 ENCOUNTER — Encounter: Payer: Self-pay | Admitting: Interventional Cardiology

## 2021-06-06 VITALS — BP 132/92 | HR 72 | Ht 64.0 in | Wt 192.0 lb

## 2021-06-06 DIAGNOSIS — I119 Hypertensive heart disease without heart failure: Secondary | ICD-10-CM

## 2021-06-06 DIAGNOSIS — I10 Essential (primary) hypertension: Secondary | ICD-10-CM | POA: Diagnosis not present

## 2021-06-06 DIAGNOSIS — R079 Chest pain, unspecified: Secondary | ICD-10-CM

## 2021-06-06 DIAGNOSIS — R7303 Prediabetes: Secondary | ICD-10-CM

## 2021-06-06 DIAGNOSIS — J449 Chronic obstructive pulmonary disease, unspecified: Secondary | ICD-10-CM

## 2021-06-06 DIAGNOSIS — F17210 Nicotine dependence, cigarettes, uncomplicated: Secondary | ICD-10-CM | POA: Diagnosis not present

## 2021-06-06 DIAGNOSIS — Z720 Tobacco use: Secondary | ICD-10-CM

## 2021-06-06 DIAGNOSIS — N1832 Chronic kidney disease, stage 3b: Secondary | ICD-10-CM

## 2021-06-06 DIAGNOSIS — E782 Mixed hyperlipidemia: Secondary | ICD-10-CM

## 2021-06-06 DIAGNOSIS — R072 Precordial pain: Secondary | ICD-10-CM

## 2021-06-06 LAB — BASIC METABOLIC PANEL
BUN/Creatinine Ratio: 10 — ABNORMAL LOW (ref 12–28)
BUN: 11 mg/dL (ref 8–27)
CO2: 22 mmol/L (ref 20–29)
Calcium: 9.6 mg/dL (ref 8.7–10.3)
Chloride: 103 mmol/L (ref 96–106)
Creatinine, Ser: 1.05 mg/dL — ABNORMAL HIGH (ref 0.57–1.00)
Glucose: 89 mg/dL (ref 70–99)
Potassium: 4.1 mmol/L (ref 3.5–5.2)
Sodium: 141 mmol/L (ref 134–144)
eGFR: 59 mL/min/{1.73_m2} — ABNORMAL LOW (ref >59)

## 2021-06-06 MED ORDER — METOPROLOL TARTRATE 100 MG PO TABS
ORAL_TABLET | ORAL | 0 refills | Status: DC
Start: 2021-06-06 — End: 2021-11-15

## 2021-06-06 NOTE — Patient Instructions (Addendum)
Medication Instructions:  ?Your physician recommends that you continue on your current medications as directed. Please refer to the Current Medication list given to you today. ? ?*If you need a refill on your cardiac medications before your next appointment, please call your pharmacy* ? ? ?Lab Work: ?BMET today ? ?If you have labs (blood work) drawn today and your tests are completely normal, you will receive your results only by: ?MyChart Message (if you have MyChart) OR ?A paper copy in the mail ?If you have any lab test that is abnormal or we need to change your treatment, we will call you to review the results. ? ? ?Testing/Procedures: ?Your physician has requested that you have cardiac CT. Cardiac computed tomography (CT) is a painless test that uses an x-ray machine to take clear, detailed pictures of your heart. For further information please visit [email or web address].[email or web address]. Please follow instruction sheet as given. ? ? ? ?Follow-Up: ?At [facility], you and your health needs are our priority.  As part of our continuing mission to provide you with exceptional heart care, we have created designated Provider Care Teams.  These Care Teams include your primary Cardiologist (physician) and Advanced Practice Providers (APPs -  Physician Assistants and Nurse Practitioners) who all work together to provide you with the care you need, when you need it. ? ?We recommend signing up for the patient portal called "MyChart".  Sign up information is provided on this After Visit Summary.  MyChart is used to connect with patients for Virtual Visits (Telemedicine).  Patients are able to view lab/test results, encounter notes, upcoming appointments, etc.  Non-urgent messages can be sent to your provider as well.   ?To learn more about what you can do with MyChart, go to [email or web address].   ? ?Your next appointment:   ?As needed ? ?The format for your next appointment:   ?In Person ? ?Provider:   ?[facility]. Blenda Bridegroom, MD   ? ? ?Other Instructions ? ? ?Your cardiac CT will be scheduled at one of the below locations:  ? ?[facility] ?[address] ?[place], [place] [address] ?[phone] ? ?OR ? ?[place] ?[address] ?[address] ?[place], [place] [address] ?[phone] ? ?If scheduled at [facility], please arrive at the [facility] and Children's Entrance (Entrance C2) of [facility] 30 minutes prior to test start time. ?You can use the FREE valet parking offered at entrance C (encouraged to control the heart rate for the test)  ?Proceed to the [facility] Radiology Department (first floor) to check-in and test prep. ? ?All radiology patients and guests should use entrance C2 at [facility], accessed from [facility], even though the hospital's physical address listed is [address] ? ? ? ?If scheduled at [facility], please arrive 15 mins early for check-in and test prep. ? ?Please follow these instructions carefully (unless otherwise directed): ? ?Hold all erectile dysfunction medications at least 3 days (72 hrs) prior to test. ? ?On the Night Before the Test: ?Be sure to Drink plenty of water. ?Do not consume any caffeinated/decaffeinated beverages or chocolate 12 hours prior to your test. ?Do not take any antihistamines 12 hours prior to your test. ? ?On the Day of the Test: ?Drink plenty of water until 1 hour prior to the test. ?Do not eat any food 4 hours prior to the test. ?You may take your regular medications prior to the test.  ?Take  metoprolol (Lopressor) two hours prior to test. ?HOLD Furosemide/Hydrochlorothiazide morning of the test. ?FEMALES- please wear underwire-free bra if available, avoid dresses & tight clothing ? ?     ?After the Test: ?Drink plenty of water. ?After receiving IV contrast, you may experience a mild flushed feeling. This is normal. ?On occasion, you may experience a mild rash  up to 24 hours after the test. This is not dangerous. If this occurs, you can take Benadryl 25 mg and increase your fluid intake. ?If you experience trouble breathing, this can be serious. If it is severe call 911 IMMEDIATELY. If it is mild, please call our office. ?If you take any of these medications: Glipizide/Metformin, Avandament, Glucavance, please do not take 48 hours after completing test unless otherwise instructed. ? ?We will call to schedule your test 2-4 weeks out understanding that some insurance companies will need an authorization prior to the service being performed.  ? ?For non-scheduling related questions, please contact the cardiac imaging nurse navigator should you have any questions/concerns: ?Marchia Bond, Cardiac Imaging Nurse Navigator ?Gordy Clement, Cardiac Imaging Nurse Navigator ?[facility] ?Direct Office Dial: [phone]  ? ?For scheduling needs, including cancellations and rescheduling, please call [place], [phone].  ? ?Important Information About Sugar ? ? ? ? ?  ?

## 2021-07-01 ENCOUNTER — Telehealth (HOSPITAL_COMMUNITY): Payer: Self-pay | Admitting: Emergency Medicine

## 2021-07-01 NOTE — Telephone Encounter (Signed)
Pt wishing to reschedule for personal reasons. New appt made for 07/21/21 ?Marchia Bond RN Navigator Cardiac Imaging ?[facility] ?[phone] Office  ?[phone] Cell ? ? ? ?

## 2021-07-04 ENCOUNTER — Ambulatory Visit (HOSPITAL_COMMUNITY): Payer: Medicare Other

## 2021-07-20 ENCOUNTER — Telehealth (HOSPITAL_COMMUNITY): Payer: Self-pay | Admitting: Emergency Medicine

## 2021-07-20 NOTE — Telephone Encounter (Signed)
Death in the family, patient will call back when ready to r/s   Marchia Bond RN Navigator Cardiac [facility] [phone] Office  [phone] Cell

## 2021-07-21 ENCOUNTER — Ambulatory Visit (HOSPITAL_COMMUNITY): Payer: Medicare Other

## 2021-09-09 ENCOUNTER — Other Ambulatory Visit: Payer: Self-pay | Admitting: Anesthesiology

## 2021-09-09 DIAGNOSIS — M545 Low back pain, unspecified: Secondary | ICD-10-CM

## 2021-09-09 DIAGNOSIS — M542 Cervicalgia: Secondary | ICD-10-CM

## 2021-09-09 DIAGNOSIS — M5136 Other intervertebral disc degeneration, lumbar region: Secondary | ICD-10-CM

## 2021-09-09 DIAGNOSIS — M5030 Other cervical disc degeneration, unspecified cervical region: Secondary | ICD-10-CM

## 2021-09-15 ENCOUNTER — Ambulatory Visit
Admission: RE | Admit: 2021-09-15 | Discharge: 2021-09-15 | Disposition: A | Discharge status: Home / Self Care | Payer: Medicare Other | Source: Ambulatory Visit | Attending: Anesthesiology | Admitting: Anesthesiology

## 2021-09-15 DIAGNOSIS — M5030 Other cervical disc degeneration, unspecified cervical region: Secondary | ICD-10-CM

## 2021-09-15 DIAGNOSIS — M5136 Other intervertebral disc degeneration, lumbar region: Secondary | ICD-10-CM

## 2021-09-15 DIAGNOSIS — M542 Cervicalgia: Secondary | ICD-10-CM

## 2021-09-15 DIAGNOSIS — M545 Low back pain, unspecified: Secondary | ICD-10-CM

## 2021-10-25 ENCOUNTER — Encounter (HOSPITAL_COMMUNITY): Payer: Self-pay

## 2021-11-15 ENCOUNTER — Telehealth (HOSPITAL_COMMUNITY): Payer: Self-pay | Admitting: Emergency Medicine

## 2021-11-15 DIAGNOSIS — R079 Chest pain, unspecified: Secondary | ICD-10-CM

## 2021-11-15 MED ORDER — METOPROLOL TARTRATE 100 MG PO TABS: ORAL_TABLET | ORAL | 0 refills | Status: DC

## 2021-11-15 NOTE — Telephone Encounter (Signed)
Reaching out to patient to offer assistance regarding upcoming cardiac imaging study; pt verbalizes understanding of appt date/time, parking situation and where to check in, pre-test NPO status and medications ordered, and verified current allergies; name and call back number provided for further questions should they arise Marchia Bond RN [facility] [phone] office [phone] cell  Arrive 830 w/c entrance DIFFICULT IV '100mg'$  metoprolol tartrate

## 2021-11-16 ENCOUNTER — Ambulatory Visit (HOSPITAL_COMMUNITY)
Admission: RE | Admit: 2021-11-16 | Discharge: 2021-11-16 | Disposition: A | Discharge status: Home / Self Care | Payer: Medicare Other | Source: Ambulatory Visit | Attending: Interventional Cardiology | Admitting: Interventional Cardiology

## 2021-11-16 DIAGNOSIS — R072 Precordial pain: Secondary | ICD-10-CM | POA: Insufficient documentation

## 2021-11-16 LAB — POCT I-STAT CREATININE: Creatinine, Ser: 1 mg/dL (ref 0.44–1.00)

## 2021-11-16 MED ORDER — NITROGLYCERIN 0.4 MG SL SUBL: 0.8000 mg | SUBLINGUAL_TABLET | Freq: Once | SUBLINGUAL | Status: AC

## 2021-11-16 MED ORDER — NITROGLYCERIN 0.4 MG SL SUBL
SUBLINGUAL_TABLET | SUBLINGUAL | Status: AC
  Filled 2021-11-16: qty 2

## 2021-11-16 MED ORDER — IOHEXOL 350 MG/ML SOLN
100.0000 mL | Freq: Once | INTRAVENOUS | Status: AC | PRN
  Administered 2021-11-16: 100 mL via INTRAVENOUS

## 2021-11-16 MED ORDER — NITROGLYCERIN 0.4 MG SL SUBL
SUBLINGUAL_TABLET | SUBLINGUAL | Status: AC
  Administered 2021-11-16: 0.8 mg via SUBLINGUAL
  Filled 2021-11-16: qty 2

## 2022-01-01 ENCOUNTER — Ambulatory Visit (HOSPITAL_COMMUNITY)
Admission: EM | Admit: 2022-01-01 | Discharge: 2022-01-01 | Disposition: A | Discharge status: Home / Self Care | Payer: Medicare Other

## 2022-01-01 DIAGNOSIS — H5712 Ocular pain, left eye: Secondary | ICD-10-CM

## 2022-01-01 NOTE — ED Provider Notes (Signed)
[facility]    CSN: [id number] Arrival date & time: 01/01/22  1735      History   Chief Complaint Chief Complaint  Patient presents with   left eye pain     HPI Jill Newton is a 66 y.o. female.   Patient presents for evaluation of left eye pain.  Feels as if there is a known foreign body present.  Denies exposure.  Endorses that symptoms began upon awakening.  Denies eye redness, drainage, visual disturbance, light sensitivity or pruritus.  Has attempted use of over-the-counter eyedrops which have been ineffective.  Denies use of contacts.   Past Medical History:  Diagnosis Date   Anxiety    Chest pain    COPD (chronic obstructive pulmonary disease) (HCC)    Wheezing, SOB   Depression    Dyspnea    When going up inclines   Edema    In ankles. Since going off meds   Enlarged heart    GERD (gastroesophageal reflux disease)    Hyperlipidemia    Hypertension    Migraine    Palpitations    Peptic ulcer     Patient Active Problem List   Diagnosis Date Noted   Lightheadedness    Hypertensive emergency 05/03/2019   Mixed hyperlipidemia 08/09/2017   Anxiety 08/09/2017   Cigarette nicotine dependence without complication 03/00/9233   Dysphagia    Atypical chest pain    Cigarette smoker 06/03/2016   COPD  GOLD 0 active smoker    Essential hypertension    Enlarged heart    GERD (gastroesophageal reflux disease)    Chest pain     Past Surgical History:  Procedure Laterality Date   24 HOUR [place] STUDY N/A 06/20/2017   Procedure: 24 HOUR PH STUDY;  Surgeon: Yetta Flock, MD;  Location: [facility];  Service: Gastroenterology;  Laterality: N/A;   CARDIAC CATHETERIZATION     ESOPHAGEAL MANOMETRY N/A 06/20/2017   Procedure: ESOPHAGEAL MANOMETRY (EM);  Surgeon: Yetta Flock, MD;  Location: [facility];  Service: Gastroenterology;  Laterality: N/A;   TUBAL LIGATION      OB History   No obstetric history on file.      Home  Medications    Prior to Admission medications   Medication Sig Start Date End Date Taking? Authorizing Provider  albuterol (PROVENTIL HFA;VENTOLIN HFA) 108 (90 Base) MCG/ACT inhaler Inhale 1-2 puffs into the lungs every 6 (six) hours as needed for wheezing or shortness of breath. 10/04/17   Palumbo, April, MD  ALPRAZolam Duanne Moron) 1 MG tablet Take 1 mg by mouth 3 (three) times daily. 04/13/19   [provider]  atorvastatin (LIPITOR) 10 MG tablet TAKE 1 TABLET(10 MG) BY MOUTH DAILY Patient taking differently: Take 10 mg by mouth daily at 6 PM. 12/28/17   Billie Ruddy, MD  benzonatate (TESSALON) 100 MG capsule Take 1 capsule (100 mg total) by mouth every 8 (eight) hours. Patient not taking: Reported on 06/06/2021 01/01/21   Hendricks Limes, PA-C  famotidine (PEPCID) 20 MG tablet Take 20 mg by mouth daily as needed for heartburn or indigestion.    [provider]  gabapentin (NEURONTIN) 300 MG capsule TAKE 1 CAPSULE BY MOUTH TWICE DAILY. START OFF TAKING 1 AT BEDTIME FOR 1 WEEK AND MAY INCREASE TO 2 TIMES DAILY Patient not taking: Reported on 06/06/2021 06/03/18   Billie Ruddy, MD  hydrOXYzine (ATARAX/VISTARIL) 50 MG tablet Take 100 mg by mouth at bedtime. Patient not taking:  Reported on 06/06/2021 03/07/19   [provider]  linaclotide Rolan Lipa) 72 MCG capsule Take 72 mcg by mouth daily as needed (constipation).  Patient not taking: Reported on 06/06/2021    [provider]  losartan-hydrochlorothiazide (HYZAAR) 100-25 MG tablet Take 1 tablet by mouth daily. 04/30/18   Billie Ruddy, MD  metoprolol tartrate (LOPRESSOR) 100 MG tablet Take one tablet by mouth 2 hours prior to CT 11/15/21   Belva Crome, MD  omeprazole (PRILOSEC) 20 MG capsule Take 1 capsule (20 mg total) by mouth daily. Patient not taking: Reported on 06/06/2021 10/04/17   Palumbo, April, MD  oseltamivir (TAMIFLU) 75 MG capsule Take 1 capsule (75 mg total) by mouth every 12 (twelve)  hours. Patient not taking: Reported on 06/06/2021 01/01/21   Hendricks Limes, PA-C  Oxycodone HCl 10 MG TABS Take 10 mg by mouth 3 (three) times daily as needed.    [provider]  Peppermint Oil 50 MG CPDR Take by mouth.    [provider]  psyllium (REGULOID) 0.52 g capsule Take 0.52 g by mouth daily.    [provider]  sucralfate (CARAFATE) 1 g tablet Take 1 g by mouth 2 (two) times daily.  Patient not taking: Reported on 06/06/2021 03/19/19   [provider]  SYMBICORT 160-4.5 MCG/ACT inhaler Inhale 2 puffs into the lungs in the morning and at bedtime. 02/11/19   [provider]  traMADol (ULTRAM) 50 MG tablet Take 100 mg by mouth 3 (three) times daily. Patient not taking: Reported on 06/06/2021 04/15/19   [provider]  venlafaxine XR (EFFEXOR-XR) 75 MG 24 hr capsule Take 75 mg by mouth daily. Patient not taking: Reported on 06/06/2021 01/09/19   [provider]  zolpidem (AMBIEN) 10 MG tablet Take 10 mg by mouth at bedtime as needed for sleep.    [provider]    Family History Family History  Problem Relation Age of Onset   Congestive Heart Failure Mother    Diabetes Mother    Hypertension Mother    Stroke Father    Cancer Brother        type unknown   Heart disease Sister    Bone cancer Maternal Aunt    Cancer Maternal Uncle        type unknown   Diabetes Sister     Social History Social History   Tobacco Use   Smoking status: Every Day    Packs/day: 0.50    Years: 46.00    Total pack years: 23.00    Types: Cigarettes   Smokeless tobacco: Never  Substance Use Topics   Alcohol use: No   Drug use: Yes    Types: Marijuana     Allergies   Codeine   Review of Systems Review of Systems  Constitutional: Negative.   HENT: Negative.    Eyes:  Positive for pain. Negative for photophobia, discharge, redness, itching and visual disturbance.  Respiratory: Negative.    Cardiovascular:  Negative.   Skin: Negative.      Physical Exam Triage Vital Signs ED Triage Vitals [01/01/22 1829]  Enc Vitals Group     BP      Pulse Rate (!) 56     Resp      Temp 98.3 F (36.8 C)     Temp Source Oral     SpO2      Weight      Height      Head Circumference  Peak Flow      Pain Score      Pain Loc      Pain Edu?      Excl. in [place]?    No data found.  Updated Vital Signs Pulse (!) 56   Temp 98.3 F (36.8 C) (Oral)   Visual Acuity Right Eye Distance:   Left Eye Distance:   Bilateral Distance:    Right Eye Near:   Left Eye Near:    Bilateral Near:     Physical Exam Constitutional:      Appearance: Normal appearance.  HENT:     Head: Normocephalic.  Eyes:     Comments: No overt abnormalities to the left eye  Pulmonary:     Effort: Pulmonary effort is normal.  Neurological:     Mental Status: She is alert and oriented to person, place, and time. Mental status is at baseline.      UC Treatments / Results  Labs (all labs ordered are listed, but only abnormal results are displayed) Labs Reviewed - No data to display  EKG   Radiology No results found.  Procedures Procedures (including critical care time)  Medications Ordered in UC Medications - No data to display  Initial Impression / Assessment and Plan / UC Course  I have reviewed the triage vital signs and the nursing notes.  Pertinent labs & imaging results that were available during my care of the patient were reviewed by me and considered in my medical decision making (see chart for details).  Left eye pain  Fluorescein staining completed, negative, no corneal abrasion noted, discussed with patient, completed eye washing with in office solution with approximately 30 to 40 cc, symptoms have resolved prior to discharge, patient may continue eye washing at home if symptoms return with follow-up with her ophthalmologist as needed Final Clinical Impressions(s) / UC Diagnoses   Final  diagnoses:  None   Discharge Instructions   None    ED Prescriptions   None    PDMP not reviewed this encounter.   Hans Eden, NP 01/04/22 [phone]

## 2022-01-01 NOTE — ED Triage Notes (Signed)
Pt reports left eye pain that started today. Pt reports feel like something is in her eye.

## 2022-01-01 NOTE — Discharge Instructions (Signed)
You are seen staining completed on your eye, no abrasion seen  Able to remove known foreign object through eye washing,  You may use eyewash at home or normal saline if symptoms reoccur   Please follow-up with urgent care if your symptoms reoccur

## 2022-07-23 ENCOUNTER — Emergency Department (HOSPITAL_COMMUNITY): Payer: 59

## 2022-07-23 ENCOUNTER — Encounter (HOSPITAL_COMMUNITY): Payer: Self-pay

## 2022-07-23 ENCOUNTER — Emergency Department (HOSPITAL_COMMUNITY)
Admission: EM | Admit: 2022-07-23 | Discharge: 2022-07-23 | Disposition: A | Discharge status: Other Acute Inpatient Hospital | Payer: 59 | Attending: Emergency Medicine | Admitting: Emergency Medicine

## 2022-07-23 DIAGNOSIS — M542 Cervicalgia: Secondary | ICD-10-CM | POA: Diagnosis not present

## 2022-07-23 DIAGNOSIS — Y92410 Unspecified street and highway as the place of occurrence of the external cause: Secondary | ICD-10-CM | POA: Diagnosis not present

## 2022-07-23 DIAGNOSIS — R0789 Other chest pain: Secondary | ICD-10-CM

## 2022-07-23 DIAGNOSIS — S0990XA Unspecified injury of head, initial encounter: Secondary | ICD-10-CM | POA: Insufficient documentation

## 2022-07-23 DIAGNOSIS — R109 Unspecified abdominal pain: Secondary | ICD-10-CM | POA: Diagnosis not present

## 2022-07-23 LAB — I-STAT CHEM 8, ED
BUN: 37 mg/dL — ABNORMAL HIGH (ref 8–23)
Calcium, Ion: 1.18 mmol/L (ref 1.15–1.40)
Chloride: 101 mmol/L (ref 98–111)
Creatinine, Ser: 1.2 mg/dL — ABNORMAL HIGH (ref 0.44–1.00)
Glucose, Bld: 95 mg/dL (ref 70–99)
HCT: 47 % — ABNORMAL HIGH (ref 36.0–46.0)
Hemoglobin: 16 g/dL — ABNORMAL HIGH (ref 12.0–15.0)
Potassium: 3.8 mmol/L (ref 3.5–5.1)
Sodium: 140 mmol/L (ref 135–145)
TCO2: 30 mmol/L (ref 22–32)

## 2022-07-23 MED ORDER — IOHEXOL 350 MG/ML SOLN
75.0000 mL | Freq: Once | INTRAVENOUS | Status: AC | PRN
  Administered 2022-07-23: 75 mL via INTRAVENOUS

## 2022-07-23 MED ORDER — MORPHINE SULFATE (PF) 4 MG/ML IV SOLN
4.0000 mg | Freq: Once | INTRAVENOUS | Status: AC
  Administered 2022-07-23: 4 mg via INTRAVENOUS
  Filled 2022-07-23: qty 1
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
## 2022-07-23 NOTE — ED Notes (Signed)
Pt would not wait n for vitals to be  checked  she was walking out the door when I received her discharge paper   iv removed as she left
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
## 2022-07-23 NOTE — ED Notes (Signed)
To ct

## 2022-08-26 ENCOUNTER — Emergency Department (HOSPITAL_COMMUNITY)
Admission: EM | Admit: 2022-08-26 | Discharge: 2022-08-27 | Disposition: A | Discharge status: Home / Self Care | Payer: 59 | Attending: Emergency Medicine | Admitting: Emergency Medicine

## 2022-08-26 ENCOUNTER — Other Ambulatory Visit: Payer: Self-pay

## 2022-08-26 DIAGNOSIS — W458XXA Other foreign body or object entering through skin, initial encounter: Secondary | ICD-10-CM | POA: Insufficient documentation

## 2022-08-26 DIAGNOSIS — S0502XA Injury of conjunctiva and corneal abrasion without foreign body, left eye, initial encounter: Secondary | ICD-10-CM | POA: Diagnosis not present

## 2022-08-26 DIAGNOSIS — H5789 Other specified disorders of eye and adnexa: Secondary | ICD-10-CM | POA: Diagnosis present

## 2022-08-26 NOTE — ED Triage Notes (Signed)
Patient reports she feels like there is something inside her left upper eyelid this evening , denies injury/no vision loss.

## 2022-08-27 MED ORDER — FLUORESCEIN SODIUM 1 MG OP STRP
1.0000 | ORAL_STRIP | Freq: Once | OPHTHALMIC | Status: AC
  Administered 2022-08-27: 1 via OPHTHALMIC
  Filled 2022-08-27: qty 1

## 2022-08-27 MED ORDER — TETRACAINE HCL 0.5 % OP SOLN
1.0000 [drp] | Freq: Once | OPHTHALMIC | Status: AC
  Administered 2022-08-27: 1 [drp] via OPHTHALMIC
  Filled 2022-08-27: qty 4

## 2022-08-27 MED ORDER — POLYMYXIN B-TRIMETHOPRIM 10000-0.1 UNIT/ML-% OP SOLN
1.0000 [drp] | OPHTHALMIC | Status: DC
  Administered 2022-08-27: 1 [drp] via OPHTHALMIC
  Filled 2022-08-27: qty 10

## 2022-08-27 NOTE — ED Provider Notes (Signed)
[facility]PT [facility] Emergency Department Provider Note MRN:  [id number]  Arrival date & time: 08/27/22     Chief Complaint   Left Eye Foreign Object   History of Present Illness   Jill Newton is a 67 y.o. year-old female presents to the ED with chief complaint of left eye irritation.  She states that she thinks she has something stuck in her eye.  She wears glasses, but not contacts.  She has tried irrigating the eye without any relief.  She denies any other associated symptoms.  Denies any vision loss.  History provided by patient.   Review of Systems  Pertinent positive and negative review of systems noted in HPI.    Physical Exam   Vitals:   08/26/22 2124  BP: 131/71  Pulse: 70  Resp: 18  Temp: 98.7 F (37.1 C)  SpO2: 98%    CONSTITUTIONAL:  well-appearing, NAD NEURO:  Alert and oriented x 3, CN 3-12 grossly intact EYES:  eyes equal and reactive, mild fluorescein uptake concerning for small abrasion centrally, no observed FB, eyelid everted, examined with slit lamp ENT/NECK:  Supple, no stridor  CARDIO:  normal rate, appears well-perfused  PULM:  No respiratory distress,  GI/GU:  non-distended,  MSK/SPINE:  No gross deformities, no edema, moves all extremities  SKIN:  no rash, atraumatic   *Additional and/or pertinent findings included in MDM below  Diagnostic and Interventional Summary    EKG Interpretation Date/Time:    Ventricular Rate:    PR Interval:    QRS Duration:    QT Interval:    QTC Calculation:   R Axis:      Text Interpretation:         Labs Reviewed - No data to display  No orders to display    Medications  tetracaine (PONTOCAINE) 0.5 % ophthalmic solution 1 drop (has no administration in time range)  fluorescein ophthalmic strip 1 strip (has no administration in time range)  trimethoprim-polymyxin b (POLYTRIM) ophthalmic solution 1 drop (has no administration in time range)     Procedures  /   Critical Care Procedures  ED Course and Medical Decision Making  I have reviewed the triage vital signs, the nursing notes, and pertinent available records from the EMR.  Social Determinants Affecting Complexity of Care: Patient has no clinically significant social determinants affecting this chief complaint..   ED Course:    Medical Decision Making Patient here with eye irritation.  Concern for FB vs corneal abrasion.  No FB seen on slit lamp.  There is evidence of mild abrasion.  Will give polytrim drops.  Return precautions discussed.  Risk Prescription drug management.         Consultants: No consultations were needed in caring for this patient.   Treatment and Plan: Emergency department workup does not suggest an emergent condition requiring admission or immediate intervention beyond  what has been performed at this time. The patient is safe for discharge and has  been instructed to return immediately for worsening symptoms, change in  symptoms or any other concerns    Final Clinical Impressions(s) / ED Diagnoses     ICD-10-CM   1. Abrasion of left cornea, initial encounter  [email or web address]       ED Discharge Orders     None         Discharge Instructions Discussed with and Provided to Patient:    Discharge Instructions      There was a small scratch on  your cornea.  This likely occurred while you were trying to get out the foreign body that was in your eye.  I didn't see any foreign body now.  Please instill 1 drop every 4 hours for the next 5 days.  This should heal in the next 48 hours.  Return for worsening symptoms.      Roxy Horseman, PA-C 08/27/22 0057    Melene Plan, DO 08/27/22 [id number]

## 2022-08-27 NOTE — Discharge Instructions (Addendum)
There was a small scratch on your cornea.  This likely occurred while you were trying to get out the foreign body that was in your eye.  I didn't see any foreign body now.  Please instill 1 drop every 4 hours for the next 5 days.  This should heal in the next 48 hours.  Return for worsening symptoms.

## 2022-08-28 ENCOUNTER — Other Ambulatory Visit: Payer: Self-pay

## 2022-08-28 ENCOUNTER — Encounter (HOSPITAL_COMMUNITY): Payer: Self-pay | Admitting: Emergency Medicine

## 2022-08-28 ENCOUNTER — Emergency Department (HOSPITAL_COMMUNITY)
Admission: EM | Admit: 2022-08-28 | Discharge: 2022-08-29 | Discharge status: Against Medical Advice | Payer: 59 | Attending: Medical | Admitting: Medical

## 2022-08-28 DIAGNOSIS — Z5321 Procedure and treatment not carried out due to patient leaving prior to being seen by health care provider: Secondary | ICD-10-CM | POA: Insufficient documentation

## 2022-08-28 DIAGNOSIS — H5789 Other specified disorders of eye and adnexa: Secondary | ICD-10-CM | POA: Insufficient documentation

## 2022-08-28 MED ORDER — FLUORESCEIN SODIUM 1 MG OP STRP: 1.0000 | ORAL_STRIP | Freq: Once | OPHTHALMIC | Status: DC

## 2022-08-28 MED ORDER — TETRACAINE HCL 0.5 % OP SOLN: 1.0000 [drp] | Freq: Once | OPHTHALMIC | Status: DC

## 2022-08-28 NOTE — ED Triage Notes (Signed)
Pt states while in the shower today approx 2 hours ago felt something get in right eye. Pt denies any vision changes. No blurred vision. Eye is not red. Nothing noticeable in eye but states feels irritated and painful.

## 2022-08-28 NOTE — ED Provider Triage Note (Signed)
Emergency Medicine Provider Triage Evaluation Note  Jill Newton , a 67 y.o. female  was evaluated in triage.  Pt complains of R eye discomfort. Was in shower today and felt like something got in eye. Has tried flushing out w/o relief. Denies any changes of vision.  Review of Systems  Positive: R eye pain Negative: edema  Physical Exam  BP (!) 134/90 (BP Location: Right Arm)   Pulse 71   Temp 99 F (37.2 C) (Oral)   Resp 17   Ht 5\' 3"  (1.6 m)   Wt 81.2 kg   SpO2 100%   BMI 31.71 kg/m  Gen:   Awake, no distress   Resp:  Normal effort  MSK:   Moves extremities without difficulty  Other:  No overt abnormalities of bilateral eyes  Medical Decision Making  Medically screening exam initiated at 5:20 PM.  Appropriate orders placed.  Jill Newton was informed that the remainder of the evaluation will be completed by another provider, this initial triage assessment does not replace that evaluation, and the importance of remaining in the ED until their evaluation is complete.     Pete Pelt, [place] 08/28/22 1721

## 2022-08-29 NOTE — ED Notes (Signed)
Pt stated he is no longer waiting

## 2022-08-30 ENCOUNTER — Encounter (HOSPITAL_COMMUNITY): Payer: Self-pay

## 2022-08-30 ENCOUNTER — Ambulatory Visit (HOSPITAL_COMMUNITY)
Admission: EM | Admit: 2022-08-30 | Discharge: 2022-08-30 | Disposition: A | Discharge status: Home / Self Care | Payer: 59 | Attending: Nurse Practitioner | Admitting: Nurse Practitioner

## 2022-08-30 DIAGNOSIS — H5711 Ocular pain, right eye: Secondary | ICD-10-CM

## 2022-08-30 MED ORDER — BACITRACIN-POLYMYXIN B 500-10000 UNIT/GM OP OINT: 1.0000 | TOPICAL_OINTMENT | Freq: Two times a day (BID) | OPHTHALMIC | 0 refills | Status: DC

## 2022-08-30 NOTE — ED Triage Notes (Signed)
Patient c/o right eye pain x 2 days. Patient denies any eye drainage, blurred vision. Patient states she went to [facility] ED 2 days ago and left prior to being seen.  Patient states she has been flushing her right eye with eye wash and polymycin eye gtts from a previous Rx.

## 2022-08-30 NOTE — ED Provider Notes (Signed)
[facility]    CSN: [id number] Arrival date & time: 08/30/22  1629      History   Chief Complaint Chief Complaint  Patient presents with   Eye Pain    HPI Jill Newton is a 67 y.o. female.   HPI  She is in today for evaluation of her right.  She reports that she was getting out of the shower and she feels a wound to her right.  She immediately start washing with water and the Polysporin eyedrops that she previously was prescribed from the emergency room.  She denies blurred vision, double vision.  She does wear glasses.  She denies use of contacts.  She denies any recent injury.  She denies getting drainage. Past Medical History:  Diagnosis Date   Anxiety    Chest pain    COPD (chronic obstructive pulmonary disease) (HCC)    Wheezing, SOB   Depression    Dyspnea    When going up inclines   Edema    In ankles. Since going off meds   Enlarged heart    GERD (gastroesophageal reflux disease)    Hyperlipidemia    Hypertension    Migraine    Palpitations    Peptic ulcer     Patient Active Problem List   Diagnosis Date Noted   Lightheadedness    Hypertensive emergency 05/03/2019   Mixed hyperlipidemia 08/09/2017   Anxiety 08/09/2017   Cigarette nicotine dependence without complication 08/09/2017   Dysphagia    Atypical chest pain    Cigarette smoker 06/03/2016   COPD  GOLD 0 active smoker    Essential hypertension    Enlarged heart    GERD (gastroesophageal reflux disease)    Chest pain     Past Surgical History:  Procedure Laterality Date   17 HOUR PH STUDY N/A 06/20/2017   Procedure: 24 HOUR PH STUDY;  Surgeon: Benancio Deeds, MD;  Location: [facility];  Service: Gastroenterology;  Laterality: N/A;   CARDIAC CATHETERIZATION     ESOPHAGEAL MANOMETRY N/A 06/20/2017   Procedure: ESOPHAGEAL MANOMETRY (EM);  Surgeon: Benancio Deeds, MD;  Location: [facility];  Service: Gastroenterology;  Laterality: N/A;   TUBAL LIGATION       OB History   No obstetric history on file.      Home Medications    Prior to Admission medications   Medication Sig Start Date End Date Taking? Authorizing Provider  bacitracin-polymyxin b (POLYSPORIN) ophthalmic ointment Place 1 Application into the right eye every 12 (twelve) hours. while awake 08/30/22  Yes Garhett Bernhard, Shana Chute, NP  albuterol (PROVENTIL HFA;VENTOLIN HFA) 108 (90 Base) MCG/ACT inhaler Inhale 1-2 puffs into the lungs every 6 (six) hours as needed for wheezing or shortness of breath. 10/04/17   Palumbo, April, MD  ALPRAZolam Prudy Feeler) 1 MG tablet Take 1 mg by mouth 3 (three) times daily. 04/13/19   [provider]  atorvastatin (LIPITOR) 10 MG tablet TAKE 1 TABLET(10 MG) BY MOUTH DAILY Patient taking differently: Take 10 mg by mouth daily at 6 PM. 12/28/17   Deeann Saint, MD  famotidine (PEPCID) 20 MG tablet Take 20 mg by mouth daily as needed for heartburn or indigestion.    [provider]  gabapentin (NEURONTIN) 300 MG capsule TAKE 1 CAPSULE BY MOUTH TWICE DAILY. START OFF TAKING 1 AT BEDTIME FOR 1 WEEK AND MAY INCREASE TO 2 TIMES DAILY Patient not taking: Reported on 06/06/2021 06/03/18   Deeann Saint, MD  hydrOXYzine (  ATARAX/VISTARIL) 50 MG tablet Take 100 mg by mouth at bedtime. Patient not taking: Reported on 06/06/2021 03/07/19   [provider]  linaclotide Karlene Einstein) 72 MCG capsule Take 72 mcg by mouth daily as needed (constipation).  Patient not taking: Reported on 06/06/2021    [provider]  losartan-hydrochlorothiazide (HYZAAR) 100-25 MG tablet Take 1 tablet by mouth daily. 04/30/18   Deeann Saint, MD  omeprazole (PRILOSEC) 20 MG capsule Take 1 capsule (20 mg total) by mouth daily. Patient not taking: Reported on 06/06/2021 10/04/17   Palumbo, April, MD  oseltamivir (TAMIFLU) 75 MG capsule Take 1 capsule (75 mg total) by mouth every 12 (twelve) hours. Patient not taking: Reported on 06/06/2021 01/01/21   Teressa Lower,  PA-C  Oxycodone HCl 10 MG TABS Take 10 mg by mouth 3 (three) times daily as needed.    [provider]  Peppermint Oil 50 MG CPDR Take by mouth.    [provider]  psyllium (REGULOID) 0.52 g capsule Take 0.52 g by mouth daily.    [provider]  sucralfate (CARAFATE) 1 g tablet Take 1 g by mouth 2 (two) times daily.  Patient not taking: Reported on 06/06/2021 03/19/19   [provider]  SYMBICORT 160-4.5 MCG/ACT inhaler Inhale 2 puffs into the lungs in the morning and at bedtime. 02/11/19   [provider]  traMADol (ULTRAM) 50 MG tablet Take 100 mg by mouth 3 (three) times daily. Patient not taking: Reported on 06/06/2021 04/15/19   [provider]  venlafaxine XR (EFFEXOR-XR) 75 MG 24 hr capsule Take 75 mg by mouth daily. Patient not taking: Reported on 06/06/2021 01/09/19   [provider]  zolpidem (AMBIEN) 10 MG tablet Take 10 mg by mouth at bedtime as needed for sleep.    [provider]    Family History Family History  Problem Relation Age of Onset   Congestive Heart Failure Mother    Diabetes Mother    Hypertension Mother    Stroke Father    Cancer Brother        type unknown   Heart disease Sister    Bone cancer Maternal Aunt    Cancer Maternal Uncle        type unknown   Diabetes Sister     Social History Social History   Tobacco Use   Smoking status: Every Day    Packs/day: 0.50    Years: 46.00    Additional pack years: 0.00    Total pack years: 23.00    Types: Cigarettes   Smokeless tobacco: Never  Vaping Use   Vaping Use: Never used  Substance Use Topics   Alcohol use: No   Drug use: Yes    Types: Marijuana     Allergies   Codeine   Review of Systems Review of Systems   Physical Exam Triage Vital Signs ED Triage Vitals  Enc Vitals Group     BP      Pulse      Resp      Temp      Temp src      SpO2      Weight      Height      Head Circumference      Peak Flow       Pain Score      Pain Loc      Pain Edu?      Excl. in GC?    No data found.  Updated Vital Signs BP (!) 144/84 (BP Location: Right Arm)   Pulse 69   Temp 98.5 F (36.9 C) (Oral)   Resp 16   SpO2 97%   Visual Acuity Right Eye Distance:   Left Eye Distance:   Bilateral Distance:    Right Eye Near:   Left Eye Near:    Bilateral Near:     Physical Exam Constitutional:      Appearance: She is normal weight.  HENT:     Head: Normocephalic and atraumatic.     Nose: Nose normal.  Eyes:     General: No scleral icterus.       Right eye: No discharge.        Left eye: No discharge.     Extraocular Movements: Extraocular movements intact.     Conjunctiva/sclera: Conjunctivae normal.     Pupils: Pupils are equal, round, and reactive to light.  Cardiovascular:     Rate and Rhythm: Normal rate.  Pulmonary:     Effort: Pulmonary effort is normal.  Musculoskeletal:        General: Normal range of motion.     Cervical back: Normal range of motion.  Skin:    General: Skin is warm and dry.     Capillary Refill: Capillary refill takes less than 2 seconds.  Neurological:     General: No focal deficit present.     Mental Status: She is alert and oriented to person, place, and time.  Psychiatric:        Mood and Affect: Mood normal.        Behavior: Behavior normal.      UC Treatments / Results  Labs (all labs ordered are listed, but only abnormal results are displayed) Labs Reviewed - No data to display  EKG   Radiology No results found.  Procedures Procedures (including critical care time)  Medications Ordered in UC Medications - No data to display  Initial Impression / Assessment and Plan / UC Course  I have reviewed the triage vital signs and the nursing notes.  Pertinent labs & imaging results that were available during my care of the patient were reviewed by me and considered in my medical decision making (see chart for details).     Eye irritation   Final Clinical Impressions(s) / UC Diagnoses   Final diagnoses:  Acute pain in right eye     Discharge Instructions      You have been treated for right ankle pain.  There may be a possible abrasion to the cornea.  You have been prescribed Polysporin ophthalmic ointment  to right.every 12 hours while awake daily.  The ointment is reimbursed by your insurance.  The recommendation is for you to establish care with an eye care specialist for further evaluation.     ED Prescriptions     Medication Sig Dispense Auth. Provider   bacitracin-polymyxin b (POLYSPORIN) ophthalmic ointment Place 1 Application into the right eye every 12 (twelve) hours. while awake 3.5 g Barbette Merino, NP      PDMP not reviewed this encounter.   Thad Ranger [place], [place] 08/30/22 [phone]

## 2022-08-30 NOTE — Discharge Instructions (Addendum)
You have been treated for right ankle pain.  There may be a possible abrasion to the cornea.  You have been prescribed Polysporin ophthalmic ointment  to right.every 12 hours while awake daily.  The ointment is reimbursed by your insurance.  The recommendation is for you to establish care with an eye care specialist for further evaluation.

## 2022-11-02 ENCOUNTER — Other Ambulatory Visit: Payer: Self-pay | Admitting: Nurse Practitioner

## 2022-11-02 DIAGNOSIS — F17211 Nicotine dependence, cigarettes, in remission: Secondary | ICD-10-CM

## 2023-01-29 ENCOUNTER — Emergency Department (HOSPITAL_COMMUNITY)
Admission: EM | Admit: 2023-01-29 | Discharge: 2023-01-30 | Discharge status: Against Medical Advice | Payer: 59 | Attending: Emergency Medicine | Admitting: Emergency Medicine

## 2023-01-29 ENCOUNTER — Encounter (HOSPITAL_COMMUNITY): Payer: Self-pay

## 2023-01-29 DIAGNOSIS — M79651 Pain in right thigh: Secondary | ICD-10-CM | POA: Insufficient documentation

## 2023-01-29 DIAGNOSIS — M79661 Pain in right lower leg: Secondary | ICD-10-CM | POA: Diagnosis present

## 2023-01-29 DIAGNOSIS — M79662 Pain in left lower leg: Secondary | ICD-10-CM | POA: Insufficient documentation

## 2023-01-29 DIAGNOSIS — M79652 Pain in left thigh: Secondary | ICD-10-CM | POA: Diagnosis not present

## 2023-01-29 DIAGNOSIS — Z5321 Procedure and treatment not carried out due to patient leaving prior to being seen by health care provider: Secondary | ICD-10-CM | POA: Diagnosis not present

## 2023-01-29 LAB — BASIC METABOLIC PANEL
Anion gap: 9 (ref 5–15)
BUN: 19 mg/dL (ref 8–23)
CO2: 28 mmol/L (ref 22–32)
Calcium: 9.8 mg/dL (ref 8.9–10.3)
Chloride: 101 mmol/L (ref 98–111)
Creatinine, Ser: 1.14 mg/dL — ABNORMAL HIGH (ref 0.44–1.00)
GFR, Estimated: 53 mL/min — ABNORMAL LOW (ref >60)
Glucose, Bld: 99 mg/dL (ref 70–99)
Potassium: 3.9 mmol/L (ref 3.5–5.1)
Sodium: 138 mmol/L (ref 135–145)

## 2023-01-29 LAB — CBC
HCT: 40.2 % (ref 36.0–46.0)
Hemoglobin: 12.7 g/dL (ref 12.0–15.0)
MCH: 28.7 pg (ref 26.0–34.0)
MCHC: 31.6 g/dL (ref 30.0–36.0)
MCV: 91 fL (ref 80.0–100.0)
Platelets: 163 10*3/uL (ref 150–400)
RBC: 4.42 MIL/uL (ref 3.87–5.11)
RDW: 16.3 % — ABNORMAL HIGH (ref 11.5–15.5)
WBC: 2.8 10*3/uL — ABNORMAL LOW (ref 4.0–10.5)
nRBC: 0 % (ref 0.0–0.2)

## 2023-01-29 LAB — CK: Total CK: 59 U/L (ref 38–234)

## 2023-01-29 NOTE — ED Triage Notes (Signed)
Pt is coming  in bilateral leg pain that starts in the thighs, she mentions it started last Monday and has progressed in the pain, there is no swelling in the legs just an aching pain. She has tried at home remedies with no pain alleviation as well. No other complaints at this time.

## 2023-01-29 NOTE — ED Provider Triage Note (Signed)
Emergency Medicine Provider Triage Evaluation Note  Jill Newton , a 67 y.o. female  was evaluated in triage.  Pt complains of bilateral leg pain. Started on Saturday. Denies trauma and numbness. Pain is in lower and upper leg bilaterally.   Review of Systems  Positive: See above Negative: See above  Physical Exam  BP 129/84 (BP Location: Right Arm)   Pulse 78   Temp 98.6 F (37 C) (Oral)   Resp (!) 21   SpO2 98%  Gen:   Awake, no distress   Resp:  Normal effort  MSK:   Moves extremities without difficulty  Other:    Medical Decision Making  Medically screening exam initiated at 4:58 PM.  Appropriate orders placed.  Jill Newton was informed that the remainder of the evaluation will be completed by another provider, this initial triage assessment does not replace that evaluation, and the importance of remaining in the ED until their evaluation is complete.  Work up started   Jill Eagle, PA-C 01/29/23 1659

## 2023-01-30 NOTE — ED Notes (Signed)
Pt name called multiple times, pt did not answer.

## 2023-06-06 IMAGING — CT CT CHEST W/O CM
2 of 5 series · 15 of 36 positions shown, 18 images · non-contrast
Comparison: Radiographs 01/01/2021.  CT 05/03/2019 and 11/01/2017.

CLINICAL DATA: Right lung mass. Chest pain with shortness of
breath. No history of malignancy.



[Series 4: chest 2.00 br40 s3 · coronal · 0.57mm/px · 3 of 163 slices shown]
[im 33/163  lung]
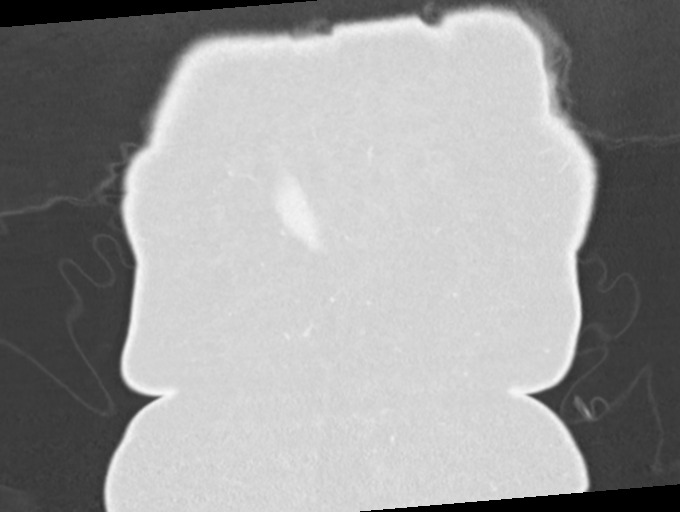
[im 65/163  lung]
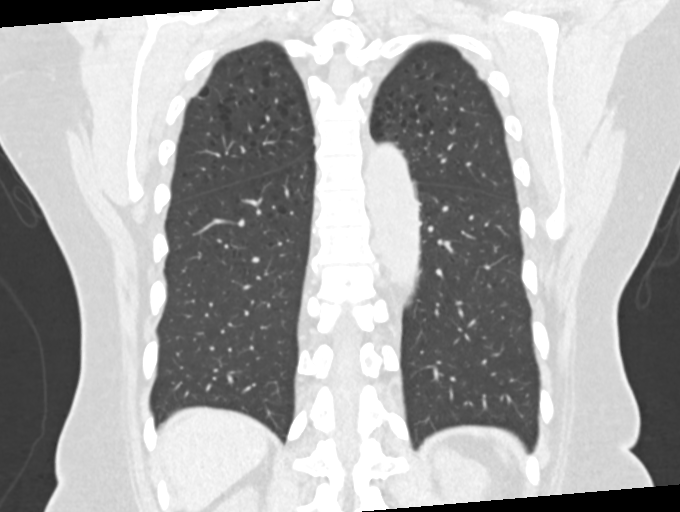
[im 98/163  lung]
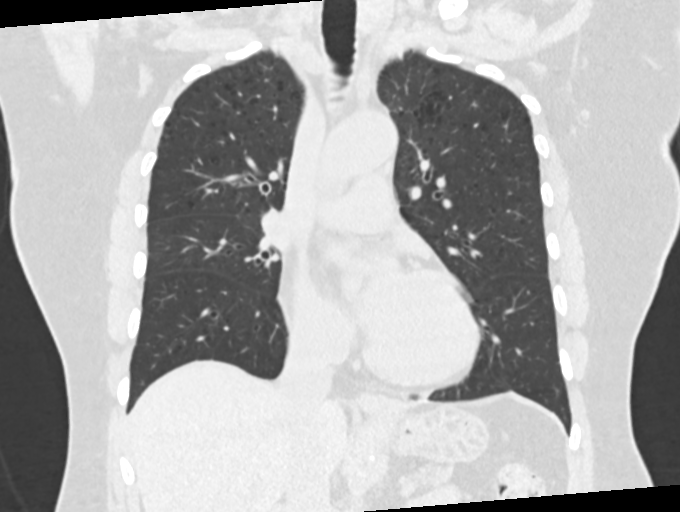

[Series 10: chest 1.00 br40 s3 super d · axial · 0.75mm/px · z∈[+1528,+1779]mm · 12 of 363 slices shown, 15 images]
[im 25/363  mediastinal]
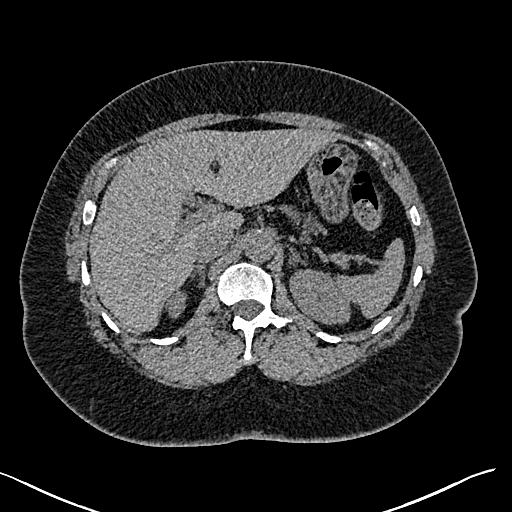
[im 25/363  lung]
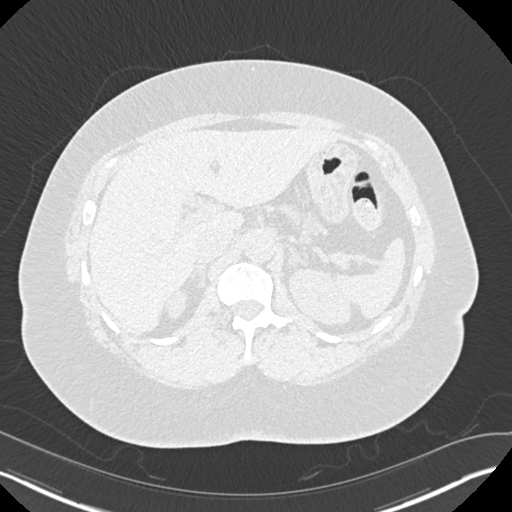
[im 49/363  lung]
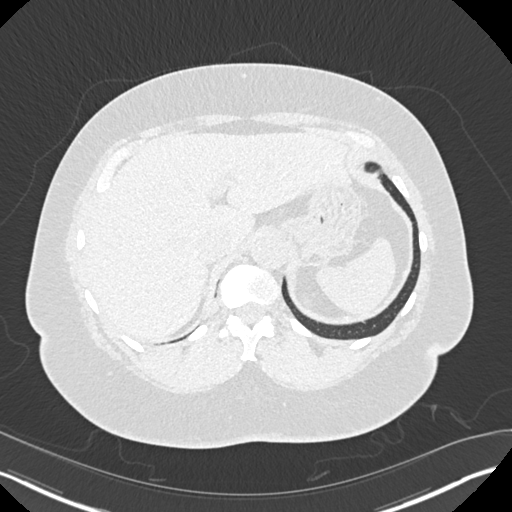
[im 73/363  lung]
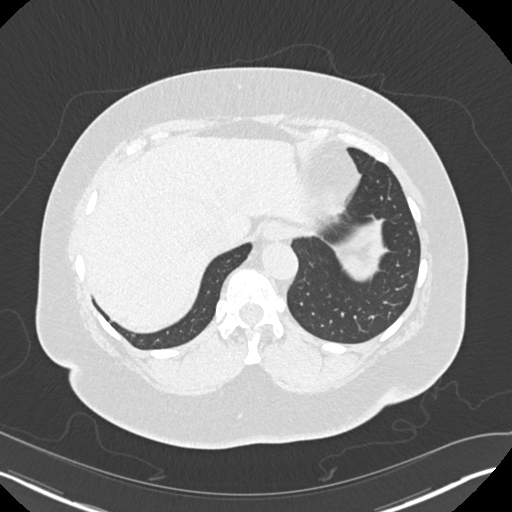
[im 121/363  lung]
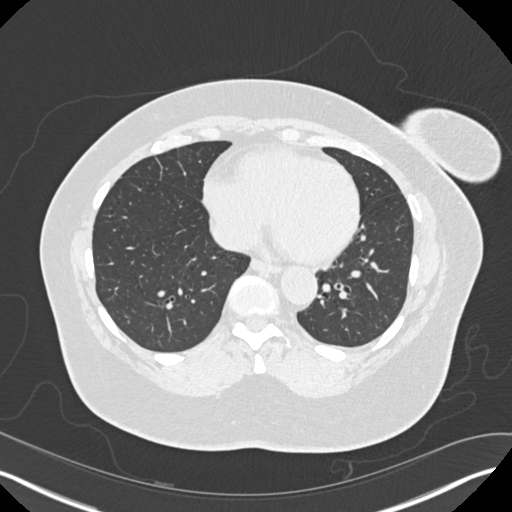
[im 145/363  mediastinal]
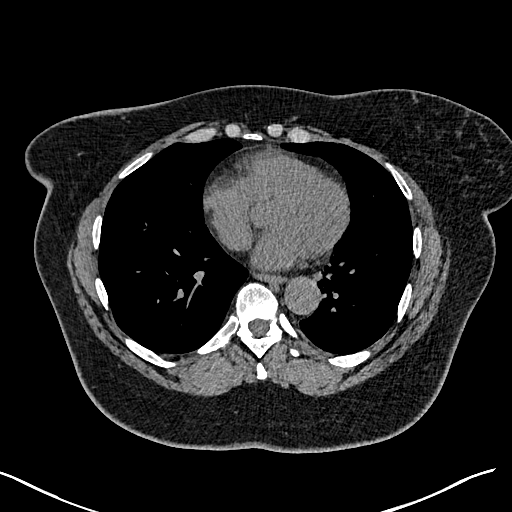
[im 145/363  lung]
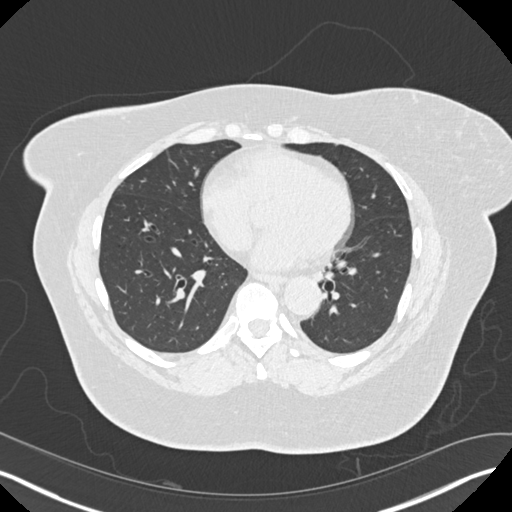
[im 169/363  lung]
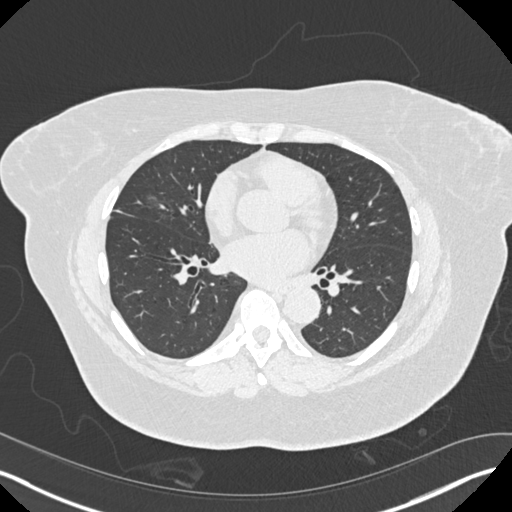
[im 194/363  lung]
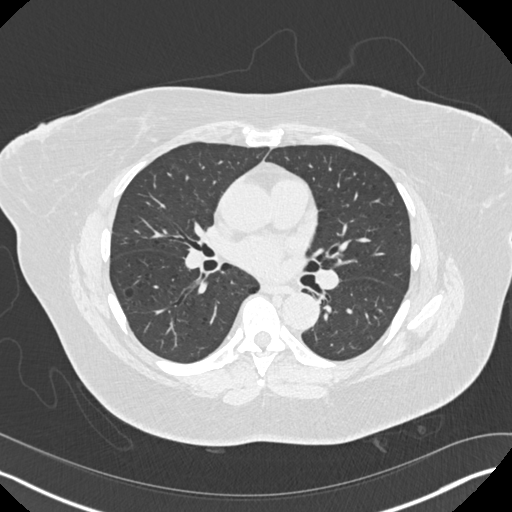
[im 218/363  lung]
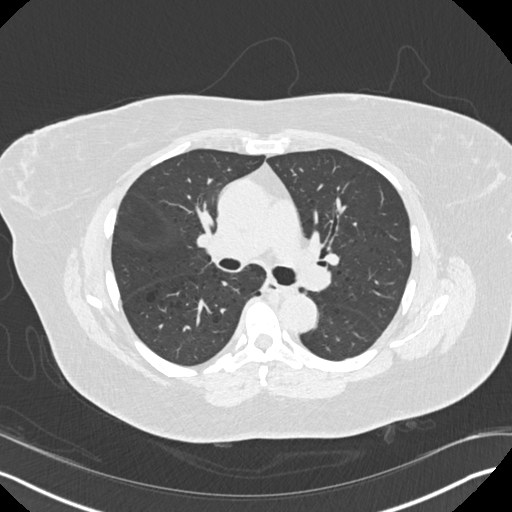
[im 242/363  mediastinal]
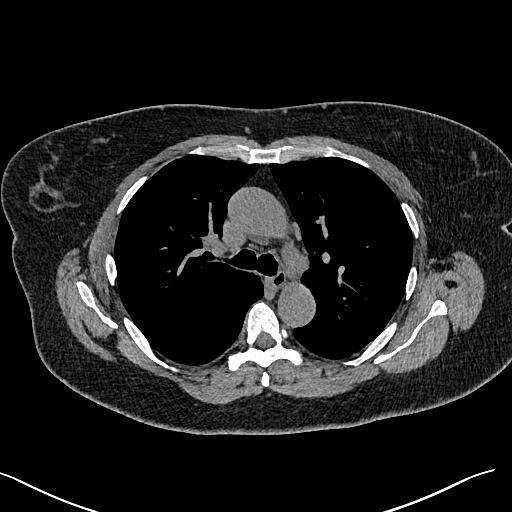
[im 242/363  lung]
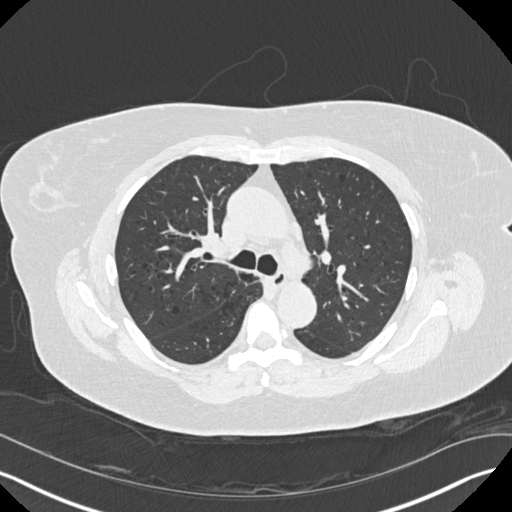
[im 290/363  lung]
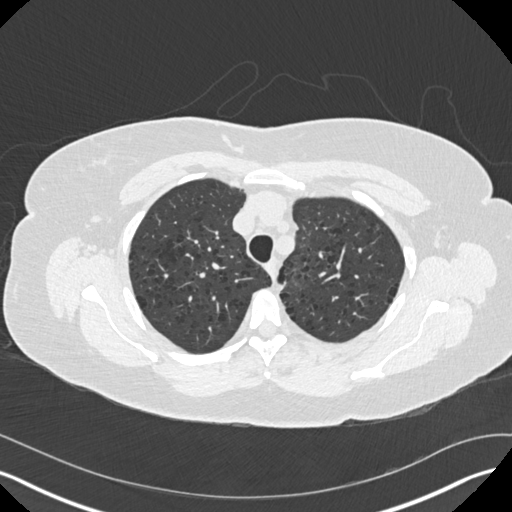
[im 314/363  lung]
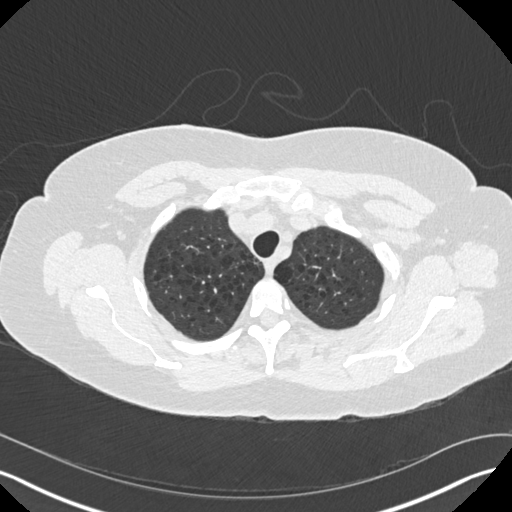
[im 338/363  lung]
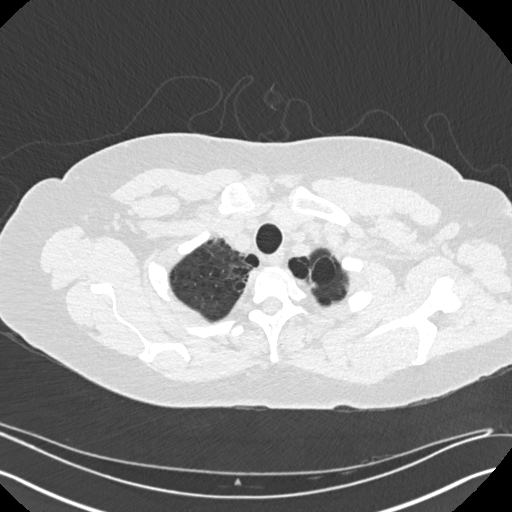

[15 of 36 positions shown; findings below may reference images not displayed]

FINDINGS: Cardiovascular: No significant vascular findings on noncontrast
imaging. The heart size is normal. There is no pericardial effusion.

Mediastinum/Nodes: There are no enlarged mediastinal, hilar or
axillary lymph nodes.Hilar assessment is limited by the lack of
intravenous contrast, although the hilar contours appear unchanged.
The thyroid gland, trachea and esophagus demonstrate no significant
findings.

Lungs/Pleura: No pleural effusion or pneumothorax. Moderate
centrilobular and paraseptal emphysema with stable linear scarring
in the right middle lobe. No suspicious pulmonary nodules. Interval
improved aeration of the right lung base without residual mucous
plugging.

Upper abdomen: The visualized upper abdomen appears stable without
suspicious findings.

Musculoskeletal/Chest wall: There is no chest wall mass or
suspicious osseous finding.
IMPRESSION: 1. Stable chest CT without acute findings or evidence of lung mass.
2. Moderate centrilobular and paraseptal Emphysema (1O1VT-0O6.4).

## 2023-06-19 ENCOUNTER — Emergency Department (HOSPITAL_COMMUNITY)

## 2023-06-19 ENCOUNTER — Other Ambulatory Visit: Payer: Self-pay

## 2023-06-19 ENCOUNTER — Emergency Department (HOSPITAL_COMMUNITY)
Admission: EM | Admit: 2023-06-19 | Discharge: 2023-06-19 | Disposition: A | Discharge status: Home / Self Care | Attending: Emergency Medicine | Admitting: Emergency Medicine

## 2023-06-19 ENCOUNTER — Encounter (HOSPITAL_COMMUNITY): Payer: Self-pay | Admitting: Radiology

## 2023-06-19 DIAGNOSIS — F1190 Opioid use, unspecified, uncomplicated: Secondary | ICD-10-CM | POA: Insufficient documentation

## 2023-06-19 DIAGNOSIS — R0789 Other chest pain: Secondary | ICD-10-CM | POA: Insufficient documentation

## 2023-06-19 DIAGNOSIS — D72819 Decreased white blood cell count, unspecified: Secondary | ICD-10-CM | POA: Insufficient documentation

## 2023-06-19 DIAGNOSIS — Z79899 Other long term (current) drug therapy: Secondary | ICD-10-CM | POA: Insufficient documentation

## 2023-06-19 DIAGNOSIS — R1013 Epigastric pain: Secondary | ICD-10-CM | POA: Insufficient documentation

## 2023-06-19 DIAGNOSIS — R062 Wheezing: Secondary | ICD-10-CM | POA: Insufficient documentation

## 2023-06-19 DIAGNOSIS — R001 Bradycardia, unspecified: Secondary | ICD-10-CM | POA: Diagnosis not present

## 2023-06-19 DIAGNOSIS — J449 Chronic obstructive pulmonary disease, unspecified: Secondary | ICD-10-CM | POA: Insufficient documentation

## 2023-06-19 DIAGNOSIS — I10 Essential (primary) hypertension: Secondary | ICD-10-CM | POA: Insufficient documentation

## 2023-06-19 LAB — COMPREHENSIVE METABOLIC PANEL WITH GFR
ALT: 14 U/L (ref 0–44)
AST: 14 U/L — ABNORMAL LOW (ref 15–41)
Albumin: 4.2 g/dL (ref 3.5–5.0)
Alkaline Phosphatase: 48 U/L (ref 38–126)
Anion gap: 6 (ref 5–15)
BUN: 19 mg/dL (ref 8–23)
CO2: 26 mmol/L (ref 22–32)
Calcium: 9.4 mg/dL (ref 8.9–10.3)
Chloride: 103 mmol/L (ref 98–111)
Creatinine, Ser: 0.94 mg/dL (ref 0.44–1.00)
GFR, Estimated: >60 mL/min (ref >60)
Glucose, Bld: 98 mg/dL (ref 70–99)
Potassium: 3.5 mmol/L (ref 3.5–5.1)
Sodium: 135 mmol/L (ref 135–145)
Total Bilirubin: 0.6 mg/dL (ref 0.0–1.2)
Total Protein: 7.3 g/dL (ref 6.5–8.1)

## 2023-06-19 LAB — URINALYSIS, ROUTINE W REFLEX MICROSCOPIC
Bilirubin Urine: NEGATIVE
Glucose, UA: NEGATIVE mg/dL
Hgb urine dipstick: NEGATIVE
Ketones, ur: NEGATIVE mg/dL
Leukocytes,Ua: NEGATIVE
Nitrite: NEGATIVE
Protein, ur: NEGATIVE mg/dL
Specific Gravity, Urine: 1.019 (ref 1.005–1.030)
pH: 6 (ref 5.0–8.0)

## 2023-06-19 LAB — RAPID URINE DRUG SCREEN, HOSP PERFORMED
Amphetamines: NOT DETECTED
Barbiturates: NOT DETECTED
Benzodiazepines: POSITIVE — AB
Cocaine: NOT DETECTED
Opiates: NOT DETECTED
Tetrahydrocannabinol: POSITIVE — AB

## 2023-06-19 LAB — CBC WITH DIFFERENTIAL/PLATELET
Abs Immature Granulocytes: 0.01 10*3/uL (ref 0.00–0.07)
Basophils Absolute: 0 10*3/uL (ref 0.0–0.1)
Basophils Relative: 0 %
Eosinophils Absolute: 0 10*3/uL (ref 0.0–0.5)
Eosinophils Relative: 2 %
HCT: 38.9 % (ref 36.0–46.0)
Hemoglobin: 12.5 g/dL (ref 12.0–15.0)
Immature Granulocytes: 0 %
Lymphocytes Relative: 35 %
Lymphs Abs: 0.9 10*3/uL (ref 0.7–4.0)
MCH: 28.7 pg (ref 26.0–34.0)
MCHC: 32.1 g/dL (ref 30.0–36.0)
MCV: 89.2 fL (ref 80.0–100.0)
Monocytes Absolute: 0.3 10*3/uL (ref 0.1–1.0)
Monocytes Relative: 10 %
Neutro Abs: 1.3 10*3/uL — ABNORMAL LOW (ref 1.7–7.7)
Neutrophils Relative %: 53 %
Platelets: 167 10*3/uL (ref 150–400)
RBC: 4.36 MIL/uL (ref 3.87–5.11)
RDW: 15.4 % (ref 11.5–15.5)
WBC: 2.5 10*3/uL — ABNORMAL LOW (ref 4.0–10.5)
nRBC: 0 % (ref 0.0–0.2)

## 2023-06-19 LAB — LIPASE, BLOOD: Lipase: 21 U/L (ref 11–51)

## 2023-06-19 LAB — TROPONIN I (HIGH SENSITIVITY): Troponin I (High Sensitivity): 3 ng/L (ref <18)

## 2023-06-19 MED ORDER — ALUM & MAG HYDROXIDE-SIMETH 200-200-20 MG/5ML PO SUSP
30.0000 mL | Freq: Once | ORAL | Status: AC
  Administered 2023-06-19: 30 mL via ORAL
  Filled 2023-06-19: qty 30

## 2023-06-19 NOTE — ED Provider Notes (Signed)
 I was called to bedside as patient is demanding discharge. See my same-day cosign of the resident note for more information.  In short, patient states that she is "leaving immediately "if I am not going to "give me some painkillers and samples to take home"  She expressed understanding of risk of missed disease by leaving at this time including pending troponin and likely need for delta troponin but patient refused any further workup going to give her narcotics.  I do not believe that narcotic pain medication is indicated in the setting of chronic chest pain.  Informed patient of this patient demanded immediate discharge.  Disposition:  Patient is requesting discharge at this time.  Given patient's understanding of risk of severe missed diagnosis based on limitations of today's evaluation and risk of interval worsening of disease including life or limb threatening pathology, will participate in shared medical decision making and patient directed discharge at this time.  Patient is welcome to return for further diagnostic evaluation/therapeutic management at any time.    Onetha Bile, MD 06/19/23 2205

## 2023-06-19 NOTE — Discharge Instructions (Addendum)
 The test we ran did not show any emergent reason for your chest pain.  It is unclear why this has been happening.  I would recommend that you continue to follow with your primary care doctor for additional evaluation.  Your GI doctor is Dr. General Kenner, if you like to reach him his contact information is below:  Jill Johnson, MD Address: [address] [place] [address], [place], [place] [address] Phone: [phone]

## 2023-06-19 NOTE — ED Provider Triage Note (Signed)
 Emergency Medicine Provider Triage Evaluation Note  Jill Newton , a 68 y.o. female  was evaluated in triage.  Pt complains of chest pain and abdominal pain of long-term duration. Patient's speech is slow and slightly slurred. Husband indicates patient buys oxycodone  of the street. She also takes tramadol  and xanax  .  Review of Systems  Positive: Abdominal pain, chest pain, shortness of breath Negative: Fever, chills, nausea, vomiting, diarrhea  Physical Exam  BP 109/71 (BP Location: Left Arm)   Pulse 81   Temp 98.7 F (37.1 C) (Oral)   Resp (!) 26   Ht 5\' 3"  (1.6 m)   Wt 81.2 kg   SpO2 97%   BMI 31.71 kg/m  Gen:   Awake, no distress   Resp:  Normal effort. Localized area of raspy lung sound right upper chest MSK:   Moves extremities without difficulty  Other:  Slow, slightly slurred speech  Medical Decision Making  Medically screening exam initiated at 7:42 PM.  Appropriate orders placed.  Jill Newton was informed that the remainder of the evaluation will be completed by another provider, this initial triage assessment does not replace that evaluation, and the importance of remaining in the ED until their evaluation is complete.     Gigi Kyle, NP 06/19/23 1949

## 2023-06-19 NOTE — ED Triage Notes (Signed)
 Pt endorses she has had chest pain and abd soreness since last June (2024). The pain goes from side to side and is not reproducible. She states her stomach is sore when she sleeps. Denies N/V/D. Endorses shortness of breath but is unable to give a time of when it happens. Pt appears very drowsy in triage and her answers are slow.

## 2023-06-19 NOTE — ED Provider Notes (Signed)
 [place] [facility] AT [facility] Provider Note   CSN: [id number] Arrival date & time: 06/19/23  1912     History  Chief Complaint  Patient presents with   Chest Pain   Abdominal Pain    Jill Newton is a 68 y.o. female with pertinent past medical history of COPD, hypertension, GERD, opiate use, and benzodiazepine use presenting with chest and abdominal pain which has been going on since 2024.  The patient states that she has been having ongoing chest pain which is primarily in the right side of the chest but occasionally in the left side of the chest.  The chest pain is nonexertional in nature, the patient cannot state anything that makes the pain better.  She states the pain frequently wakes her up in the night.  She also has associated epigastric pain.  She denies alcohol use, but does endorse marijuana use as well as use of opiates and benzodiazepines.  She does appear lethargic in the room, per friend who is at bedside and this is not unusual for her.   Chest Pain Associated symptoms: abdominal pain, cough and shortness of breath   Associated symptoms: no fever   Abdominal Pain Associated symptoms: chest pain, cough and shortness of breath   Associated symptoms: no chills and no fever        Home Medications Prior to Admission medications   Medication Sig Start Date End Date Taking? Authorizing Provider  albuterol  (PROVENTIL  HFA;VENTOLIN  HFA) 108 (90 Base) MCG/ACT inhaler Inhale 1-2 puffs into the lungs every 6 (six) hours as needed for wheezing or shortness of breath. 10/04/17   Palumbo, April, MD  ALPRAZolam  (XANAX ) 1 MG tablet Take 1 mg by mouth 3 (three) times daily. 04/13/19   [provider]  atorvastatin  (LIPITOR) 10 MG tablet TAKE 1 TABLET(10 MG) BY MOUTH DAILY Patient taking differently: Take 10 mg by mouth daily at 6 PM. 12/28/17   Viola Greulich, MD  bacitracin -polymyxin b  (POLYSPORIN ) ophthalmic ointment Place 1  Application into the right eye every 12 (twelve) hours. while awake 08/30/22   Gregoria Leas, NP  famotidine  (PEPCID ) 20 MG tablet Take 20 mg by mouth daily as needed for heartburn or indigestion.    [provider]  gabapentin  (NEURONTIN ) 300 MG capsule TAKE 1 CAPSULE BY MOUTH TWICE DAILY. START OFF TAKING 1 AT BEDTIME FOR 1 WEEK AND MAY INCREASE TO 2 TIMES DAILY Patient not taking: Reported on 06/06/2021 06/03/18   Viola Greulich, MD  hydrOXYzine (ATARAX/VISTARIL) 50 MG tablet Take 100 mg by mouth at bedtime. Patient not taking: Reported on 06/06/2021 03/07/19   [provider]  linaclotide  (LINZESS ) 72 MCG capsule Take 72 mcg by mouth daily as needed (constipation).  Patient not taking: Reported on 06/06/2021    [provider]  losartan -hydrochlorothiazide  (HYZAAR) 100-25 MG tablet Take 1 tablet by mouth daily. 04/30/18   Viola Greulich, MD  omeprazole  (PRILOSEC) 20 MG capsule Take 1 capsule (20 mg total) by mouth daily. Patient not taking: Reported on 06/06/2021 10/04/17   Palumbo, April, MD  oseltamivir  (TAMIFLU ) 75 MG capsule Take 1 capsule (75 mg total) by mouth every 12 (twelve) hours. Patient not taking: Reported on 06/06/2021 01/01/21   Darletta Ehrich, PA-C  Oxycodone  HCl 10 MG TABS Take 10 mg by mouth 3 (three) times daily as needed.    [provider]  Peppermint Oil 50 MG CPDR Take by mouth.    [provider]  psyllium (REGULOID) 0.52 g capsule Take 0.52 g by mouth daily.    [provider]  sucralfate  (CARAFATE ) 1 g tablet Take 1 g by mouth 2 (two) times daily.  Patient not taking: Reported on 06/06/2021 03/19/19   [provider]  SYMBICORT 160-4.5 MCG/ACT inhaler Inhale 2 puffs into the lungs in the morning and at bedtime. 02/11/19   [provider]  traMADol  (ULTRAM ) 50 MG tablet Take 100 mg by mouth 3 (three) times daily. Patient not taking: Reported on 06/06/2021 04/15/19   [provider]   venlafaxine XR (EFFEXOR-XR) 75 MG 24 hr capsule Take 75 mg by mouth daily. Patient not taking: Reported on 06/06/2021 01/09/19   [provider]  zolpidem (AMBIEN) 10 MG tablet Take 10 mg by mouth at bedtime as needed for sleep.    [provider]      Allergies    Codeine    Review of Systems   Review of Systems  Constitutional:  Negative for chills and fever.  Respiratory:  Positive for cough, shortness of breath and wheezing.   Cardiovascular:  Positive for chest pain.  Gastrointestinal:  Positive for abdominal pain.    Physical Exam Updated Vital Signs BP (!) 142/72   Pulse 63   Temp 98.5 F (36.9 C) (Oral)   Resp (!) 31   Ht 5\' 3"  (1.6 m)   Wt 81.2 kg   SpO2 97%   BMI 31.71 kg/m  Physical Exam Constitutional:      Comments: Somnolent, answers all questions appropriately.  Eyes:     Comments: Pupils are pinpoint.  Cardiovascular:     Rate and Rhythm: Regular rhythm. Bradycardia present.     Heart sounds: Normal heart sounds.  Pulmonary:     Effort: Pulmonary effort is normal.     Breath sounds: Wheezing present.  Abdominal:     Comments: Tenderness palpation of the epigastrium, no rebound no guarding.  No signs of peritonitis.  Musculoskeletal:     Right lower leg: No edema.     Left lower leg: No edema.     ED Results / Procedures / Treatments   Labs (all labs ordered are listed, but only abnormal results are displayed) Labs Reviewed  CBC WITH DIFFERENTIAL/PLATELET - Abnormal; Notable for the following components:      Result Value   WBC 2.5 (*)    Neutro Abs 1.3 (*)    All other components within normal limits  COMPREHENSIVE METABOLIC PANEL WITH GFR - Abnormal; Notable for the following components:   AST 14 (*)    All other components within normal limits  LIPASE, BLOOD  URINALYSIS, ROUTINE W REFLEX MICROSCOPIC  RAPID URINE DRUG SCREEN, HOSP PERFORMED  TROPONIN I (HIGH SENSITIVITY)  TROPONIN I (HIGH SENSITIVITY)     EKG None  Radiology DG Chest 1 View Result Date: 06/19/2023 EXAM: 1 VIEW XRAY OF THE CHEST 06/19/2023 07:56:06 PM COMPARISON: 07/23/2022 CLINICAL HISTORY: Chest pain. Pt endorses she has had chest pain and abd soreness since last June (2024). The pain goes from side to side and is not reproducible. She states her stomach is sore when she sleeps. Denies N/V/D. Endorses shortness of breath but is unable to give a time of when it happens. FINDINGS: LUNGS AND PLEURA: Mild patchy bilateral lower opacities, atelectasis versus pneumonia. No pleural effusion. No pneumothorax. HEART AND MEDIASTINUM: Stable mild cardiomegaly. No frank interstitial edema. BONES AND SOFT TISSUES: No acute osseous abnormality. IMPRESSION: 1. Mild patchy bilateral lower opacities, atelectasis  versus pneumonia. 2. Stable mild cardiomegaly. No frank interstitial edema. Electronically signed by: Zadie Herter MD 06/19/2023 08:00 PM EDT RP Workstation: [id number]    Procedures Procedures    Medications Ordered in ED Medications  alum & mag hydroxide-simeth (MAALOX/MYLANTA) 200-200-20 MG/5ML suspension 30 mL (30 mLs Oral Given 06/19/23 2116)    ED Course/ Medical Decision Making/ A&P Clinical Course as of 06/19/23 2138  Tue Jun 19, 2023  2043 Stable Atypical chest pain Chronic in nature  [CC]    Clinical Course User Index [CC] Onetha Bile, MD                                 Medical Decision Making Risk OTC drugs.   Overall this is a 68 year old female with GERD, chronic chest pain presenting with chronic chest abdominal pain ongoing since at least 2024.  Per the patient's description, most likely diagnosis would be GERD/peptic ulcer disease/gastritis.  No chest exertional pain, no better with rest.  Chest x-ray today demonstrating atelectasis versus pneumonia but no signs of pneumonia clinically.  Lipase within normal limits, unlikely to represent pancreatitis.  CMP within normal limits, no evidence of  biliary pathology.  CBC with low white cell count, this appears chronic for this patient.  Otherwise within normal limits.  EKG demonstrating no ischemic changes.  COVID flu RSV negative.  Urinalysis without abnormalities, urine drug screen positive for benzos and thc.  Troponins pending at time of patient discharge.  Patient given GI cocktail to see if this helps improve their symptoms.  They are on chronic opioid therapy and are very sedated at today's visit.  Would not recommend additional opioid analgesia.  Per chart review it seems that she has been having similar symptoms for many years.  She has had extensive workup for this in the past including EGD, coronary angiography, echo, and imaging studies of the chest which have been unrevealing for etiology of her pain.  No signs of emergent pathology today in the emergency department.  The patient is interested in following up with her GI doctor, patient also recommended to follow-up with their primary care doctor for ongoing evaluation.        Final Clinical Impression(s) / ED Diagnoses Final diagnoses:  None    Rx / DC Orders ED Discharge Orders     None         Sheree Dieter, MD 06/24/23 1323    Onetha Bile, MD 06/28/23 2334

## 2024-01-04 ENCOUNTER — Emergency Department (HOSPITAL_COMMUNITY)
Admission: EM | Admit: 2024-01-04 | Discharge: 2024-01-05 | Disposition: A | Discharge status: Other Acute Inpatient Hospital | Attending: Emergency Medicine | Admitting: Emergency Medicine

## 2024-01-04 ENCOUNTER — Other Ambulatory Visit: Payer: Self-pay

## 2024-01-04 ENCOUNTER — Emergency Department (HOSPITAL_COMMUNITY)

## 2024-01-04 ENCOUNTER — Encounter (HOSPITAL_COMMUNITY): Payer: Self-pay | Admitting: *Deleted

## 2024-01-04 DIAGNOSIS — I10 Essential (primary) hypertension: Secondary | ICD-10-CM | POA: Insufficient documentation

## 2024-01-04 DIAGNOSIS — F19151 Other psychoactive substance abuse with psychoactive substance-induced psychotic disorder with hallucinations: Secondary | ICD-10-CM | POA: Insufficient documentation

## 2024-01-04 DIAGNOSIS — R451 Restlessness and agitation: Secondary | ICD-10-CM | POA: Diagnosis not present

## 2024-01-04 DIAGNOSIS — F29 Unspecified psychosis not due to a substance or known physiological condition: Secondary | ICD-10-CM | POA: Insufficient documentation

## 2024-01-04 DIAGNOSIS — F1910 Other psychoactive substance abuse, uncomplicated: Secondary | ICD-10-CM | POA: Insufficient documentation

## 2024-01-04 DIAGNOSIS — R456 Violent behavior: Secondary | ICD-10-CM | POA: Diagnosis present

## 2024-01-04 DIAGNOSIS — F19959 Other psychoactive substance use, unspecified with psychoactive substance-induced psychotic disorder, unspecified: Secondary | ICD-10-CM | POA: Insufficient documentation

## 2024-01-04 DIAGNOSIS — Z7689 Persons encountering health services in other specified circumstances: Secondary | ICD-10-CM

## 2024-01-04 DIAGNOSIS — Z7951 Long term (current) use of inhaled steroids: Secondary | ICD-10-CM | POA: Insufficient documentation

## 2024-01-04 DIAGNOSIS — F19159 Other psychoactive substance abuse with psychoactive substance-induced psychotic disorder, unspecified: Secondary | ICD-10-CM

## 2024-01-04 DIAGNOSIS — J449 Chronic obstructive pulmonary disease, unspecified: Secondary | ICD-10-CM | POA: Insufficient documentation

## 2024-01-04 DIAGNOSIS — Z79899 Other long term (current) drug therapy: Secondary | ICD-10-CM | POA: Diagnosis not present

## 2024-01-04 DIAGNOSIS — F1510 Other stimulant abuse, uncomplicated: Secondary | ICD-10-CM | POA: Insufficient documentation

## 2024-01-04 DIAGNOSIS — Z046 Encounter for general psychiatric examination, requested by authority: Secondary | ICD-10-CM

## 2024-01-04 DIAGNOSIS — R4689 Other symptoms and signs involving appearance and behavior: Secondary | ICD-10-CM

## 2024-01-04 LAB — URINALYSIS, ROUTINE W REFLEX MICROSCOPIC
Bacteria, UA: NONE SEEN
Bilirubin Urine: NEGATIVE
Glucose, UA: NEGATIVE mg/dL
Hgb urine dipstick: NEGATIVE
Ketones, ur: 5 mg/dL — AB
Leukocytes,Ua: NEGATIVE
Nitrite: NEGATIVE
Protein, ur: 30 mg/dL — AB
Specific Gravity, Urine: 1.027 (ref 1.005–1.030)
pH: 5 (ref 5.0–8.0)

## 2024-01-04 LAB — CBC
HCT: 39.4 % (ref 36.0–46.0)
Hemoglobin: 12.5 g/dL (ref 12.0–15.0)
MCH: 28 pg (ref 26.0–34.0)
MCHC: 31.7 g/dL (ref 30.0–36.0)
MCV: 88.1 fL (ref 80.0–100.0)
Platelets: 200 K/uL (ref 150–400)
RBC: 4.47 MIL/uL (ref 3.87–5.11)
RDW: 15.4 % (ref 11.5–15.5)
WBC: 6.1 K/uL (ref 4.0–10.5)
nRBC: 0 % (ref 0.0–0.2)

## 2024-01-04 LAB — URINE DRUG SCREEN
Amphetamines: POSITIVE — AB
Barbiturates: NEGATIVE
Benzodiazepines: POSITIVE — AB
Cocaine: NEGATIVE
Fentanyl: NEGATIVE
Methadone Scn, Ur: NEGATIVE
Opiates: NEGATIVE
Tetrahydrocannabinol: POSITIVE — AB

## 2024-01-04 LAB — COMPREHENSIVE METABOLIC PANEL WITH GFR
ALT: 12 U/L (ref 0–44)
AST: 25 U/L (ref 15–41)
Albumin: 4.6 g/dL (ref 3.5–5.0)
Alkaline Phosphatase: 65 U/L (ref 38–126)
Anion gap: 22 — ABNORMAL HIGH (ref 5–15)
BUN: 19 mg/dL (ref 8–23)
CO2: 15 mmol/L — ABNORMAL LOW (ref 22–32)
Calcium: 10 mg/dL (ref 8.9–10.3)
Chloride: 101 mmol/L (ref 98–111)
Creatinine, Ser: 1.33 mg/dL — ABNORMAL HIGH (ref 0.44–1.00)
GFR, Estimated: 43 mL/min — ABNORMAL LOW (ref >60)
Glucose, Bld: 110 mg/dL — ABNORMAL HIGH (ref 70–99)
Potassium: 3.7 mmol/L (ref 3.5–5.1)
Sodium: 139 mmol/L (ref 135–145)
Total Bilirubin: 0.5 mg/dL (ref 0.0–1.2)
Total Protein: 8.1 g/dL (ref 6.5–8.1)

## 2024-01-04 LAB — ETHANOL: Alcohol, Ethyl (B): <15 mg/dL (ref <15)

## 2024-01-04 LAB — SALICYLATE LEVEL: Salicylate Lvl: <7 mg/dL — ABNORMAL LOW (ref 7.0–30.0)

## 2024-01-04 LAB — CK: Total CK: 391 U/L — ABNORMAL HIGH (ref 38–234)

## 2024-01-04 LAB — ACETAMINOPHEN LEVEL: Acetaminophen (Tylenol), Serum: <10 ug/mL — ABNORMAL LOW (ref 10–30)

## 2024-01-04 MED ORDER — OLANZAPINE 2.5 MG PO TABS
2.5000 mg | ORAL_TABLET | Freq: Two times a day (BID) | ORAL | Status: DC
  Administered 2024-01-04: 2.5 mg via ORAL
  Filled 2024-01-04 (×2): qty 1

## 2024-01-04 MED ORDER — LOPERAMIDE HCL 2 MG PO CAPS: 2.0000 mg | ORAL_CAPSULE | ORAL | Status: DC | PRN

## 2024-01-04 MED ORDER — LORAZEPAM 2 MG/ML IJ SOLN
1.0000 mg | Freq: Once | INTRAMUSCULAR | Status: AC
  Administered 2024-01-04: 1 mg via INTRAMUSCULAR
  Filled 2024-01-04: qty 1

## 2024-01-04 MED ORDER — MIRTAZAPINE 7.5 MG PO TABS
15.0000 mg | ORAL_TABLET | Freq: Every day | ORAL | Status: DC
  Administered 2024-01-04: 15 mg via ORAL
  Filled 2024-01-04: qty 2

## 2024-01-04 MED ORDER — LORAZEPAM 2 MG/ML IJ SOLN
1.0000 mg | Freq: Once | INTRAMUSCULAR | Status: DC
  Filled 2024-01-04: qty 1

## 2024-01-04 MED ORDER — ADULT MULTIVITAMIN W/MINERALS CH
1.0000 | ORAL_TABLET | Freq: Every day | ORAL | Status: DC
  Filled 2024-01-04: qty 1

## 2024-01-04 MED ORDER — HALOPERIDOL LACTATE 5 MG/ML IJ SOLN
INTRAMUSCULAR | Status: AC
  Administered 2024-01-04: 5 mg via INTRAMUSCULAR
  Filled 2024-01-04: qty 1

## 2024-01-04 MED ORDER — CHLORDIAZEPOXIDE HCL 25 MG PO CAPS: 25.0000 mg | ORAL_CAPSULE | Freq: Four times a day (QID) | ORAL | Status: DC | PRN

## 2024-01-04 MED ORDER — SODIUM CHLORIDE 0.9 % IV BOLUS
1000.0000 mL | Freq: Once | INTRAVENOUS | Status: AC
  Administered 2024-01-04: 1000 mL via INTRAVENOUS

## 2024-01-04 MED ORDER — LORAZEPAM 2 MG/ML IJ SOLN: 1.0000 mg | Freq: Once | INTRAMUSCULAR | Status: DC

## 2024-01-04 MED ORDER — HALOPERIDOL LACTATE 5 MG/ML IJ SOLN
5.0000 mg | Freq: Once | INTRAMUSCULAR | Status: AC
  Administered 2024-01-04: 5 mg via INTRAMUSCULAR
  Filled 2024-01-04: qty 1

## 2024-01-04 MED ORDER — ONDANSETRON 4 MG PO TBDP: 4.0000 mg | ORAL_TABLET | Freq: Four times a day (QID) | ORAL | Status: DC | PRN

## 2024-01-04 MED ORDER — THIAMINE HCL 100 MG/ML IJ SOLN
100.0000 mg | Freq: Once | INTRAMUSCULAR | Status: DC
  Filled 2024-01-04: qty 2
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
## 2024-01-04 NOTE — ED Notes (Addendum)
 Staff has removed all supplies in the room. Minus the Computer. All supplies are near the Linen cart labeled. All Cabinets have been locked.

## 2024-01-04 NOTE — Progress Notes (Signed)
 This provider attempted to assess patient this morning. However, on approach patient was lying down in bed asleep in no acute distress and was too somnolent to compete the assessment. She was unable to state her name or date of birth and stated that she does not have a name or birthday. She presented with incoherent speech and disorganized thoughts on exam. Per MAR, patient was administered Haldol 5 mg IM at 0157 AM and 0230 and Ativan  1 mg IM at 0347. Patient appears to be sedated from IM medication administration. Will obtained collateral and revaluate patient later this afternoon.
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
## 2024-01-04 NOTE — BH Assessment (Signed)
 Pt was given 1mg  Ativan  IM at 03:47 and 5mg  Haldol IM at 01:57 and 02:30.  NT Smithey confirmed that patient was too sedated to be teleassessed now.

## 2024-01-04 NOTE — ED Notes (Signed)
 Delay in care, unable to collect labs and EKG on pt, Pt actively having hallucinations. Not redirectable.

## 2024-01-04 NOTE — ED Notes (Addendum)
 Pt is still very agitated, hallucinating and trying to remove equipment from the room.

## 2024-01-04 NOTE — Progress Notes (Addendum)
 Pt has been accepted to [facility] on 01/04/2024 . Unit assignment:[id number]  Pt meets inpatient criteria per Wyline Pizza, NP   Attending Physician will be Dr. Odella Jumper  Report can be called to: - [phone]   Pt can arrive 11/15 after 8AM   Care Team Notified: Landry Bull, RN, Wyline Pizza, NP
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
## 2024-01-04 NOTE — ED Notes (Signed)
 Patient refused medication this shift. Patient also refused VS at 5:30 PM

## 2024-01-04 NOTE — ED Notes (Signed)
 Pt taken out of non violent restraints and ambulated to bathroom for urine sample

## 2024-01-04 NOTE — ED Provider Notes (Signed)
 Patient signed out to me by PA Medford Lites.  Here for hallucinations and psych evaluation.  Patient is in IVC status.  Awaiting medical clearance.  Anion gap is 22 and noted to have mild AKI.  Treating with fluids.  Plan is medical clearance and psych hold for TTS evaluation. Physical Exam  BP (!) 128/107   Pulse 80   Temp 98.5 F (36.9 C)   Resp 17   SpO2 95%   Physical Exam  Procedures  Procedures  ED Course / MDM   Clinical Course as of 01/04/24 0815  Fri Jan 04, 2024  0631 Hallucinating. No psych h/o. Awaiting med clearance and TTS eval. Already IVC. Anion gap addressed with fluid. [JR]    Clinical Course User Index [JR] Lang Norleen POUR, PA-C   Medical Decision Making Amount and/or Complexity of Data Reviewed Labs: ordered. Radiology: ordered. ECG/medicine tests: ordered.  Risk Prescription drug management.   CT unremarkable.  Patient is hemodynamically stable and well-appearing.  Placed in psych hold.       Lang Norleen POUR, PA-C 01/04/24 0817    Emil Share, DO 01/04/24 [phone]

## 2024-01-04 NOTE — Progress Notes (Signed)
 Inpatient Psychiatric Referral  Patient was recommended inpatient per Wyline Pizza, NP  . There are no available beds at [facility], per [facility]. Patient was referred to the following out of network facilities:  [facility] Provider Address Phone Fax  [facility]  [address] [place] [address] [phone]  [facility]  [address] [place], [place] [address] [phone]  [facility]  [address] [place], [place] [address] [phone]  [facility]  [address] [place] [address] [phone]  [facility]  [address], [place] [address] [phone]  [facility]  [address] [place] [address] [phone]  [facility]  [address] [place], [place] [phone]  [facility]  [address] [place] [address] [phone]    Situation ongoing, CSW to continue following and update chart as more information becomes available.   Harrie Sofia MSW, LCSWA 01/04/2024
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
## 2024-01-04 NOTE — ED Notes (Signed)
 Pt changed into purple scrubs, belongings bagged and placed in cabinet. Pt violent with staff, cursing, grabbing and scratching staff. Pt with flight of ideas.
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
## 2024-01-04 NOTE — ED Notes (Signed)
 Attempted lab draw and pt. Pulled needle from arm.

## 2024-01-04 NOTE — ED Notes (Signed)
 Patient to room 36. Patient cooperative. Patient oriented to unit and room.

## 2024-01-05 MED ORDER — ALUM & MAG HYDROXIDE-SIMETH 200-200-20 MG/5ML PO SUSP: 15.0000 mL | Freq: Once | ORAL | Status: DC

## 2024-01-05 NOTE — ED Notes (Signed)
 Called report to deborah on [address] at [place]. Received phone call back from sheriff stating they would transport patients this morning

## 2024-01-29 NOTE — Progress Notes (Deleted)
 Ellouise Console, PA-C [address] [place], [place]  [address] Phone: [phone]   Gastroenterology Consultation  Referring Provider:     Claudene Prentice DELENA Mickey., FNP Primary Care Physician:  Claudene Prentice DELENA Mickey., FNP Primary Gastroenterologist:  Ellouise Console, PA-C / Elspeth Naval, MD  Reason for Consultation:     Upper abdominal pain.  Discuss EGD.  History of adenomatous colon polyp.  Repeat colonoscopy with extra prep.        HPI:   Discussed the use of AI scribe software for clinical note transcription with the patient, who gave verbal consent to proceed.  History of Present Illness   05/2017 EGD by Dr. Naval: Normal esophagus.  Mild gastritis and duodenitis.  Biopsies negative for H. pylori and EOE.  Biopsies consistent with GERD.  05/2017 colonoscopy by Dr. Naval: Fair prep.  12 mm tubular adenoma polyp removed from ascending colon.  Internal hemorrhoids.  Repeat colonoscopy was recommended in 6 to 12 months due to suboptimal bowel prep.  06/2017 esophageal manometry with impedance  Past Medical History:  Diagnosis Date   Anxiety    Chest pain    COPD (chronic obstructive pulmonary disease) (HCC)    Wheezing, SOB   Depression    Dyspnea    When going up inclines   Edema    In ankles. Since going off meds   Enlarged heart    GERD (gastroesophageal reflux disease)    Hyperlipidemia    Hypertension    Migraine    Palpitations    Peptic ulcer     Past Surgical History:  Procedure Laterality Date   88 HOUR PH STUDY N/A 06/20/2017   Procedure: 24 HOUR PH STUDY;  Surgeon: Naval Elspeth SQUIBB, MD;  Location: [facility];  Service: Gastroenterology;  Laterality: N/A;   CARDIAC CATHETERIZATION     ESOPHAGEAL MANOMETRY N/A 06/20/2017   Procedure: ESOPHAGEAL MANOMETRY (EM);  Surgeon: Naval Elspeth SQUIBB, MD;  Location: [facility];  Service: Gastroenterology;  Laterality: N/A;   TUBAL LIGATION      Prior to Admission medications   Medication Sig Start  Date End Date Taking? Authorizing Provider  albuterol  (PROVENTIL  HFA;VENTOLIN  HFA) 108 (90 Base) MCG/ACT inhaler Inhale 1-2 puffs into the lungs every 6 (six) hours as needed for wheezing or shortness of breath. 10/04/17   Palumbo, April, MD  ALPRAZolam  (XANAX ) 1 MG tablet Take 1 mg by mouth 3 (three) times daily. 04/13/19   [provider]  atorvastatin  (LIPITOR) 10 MG tablet TAKE 1 TABLET(10 MG) BY MOUTH DAILY Patient taking differently: Take 10 mg by mouth daily at 6 PM. 12/28/17   Mercer Clotilda SAUNDERS, MD  bacitracin -polymyxin b  (POLYSPORIN ) ophthalmic ointment Place 1 Application into the right eye every 12 (twelve) hours. while awake 08/30/22   Myrna Camelia HERO, NP  famotidine  (PEPCID ) 20 MG tablet Take 20 mg by mouth daily as needed for heartburn or indigestion.    [provider]  gabapentin  (NEURONTIN ) 300 MG capsule TAKE 1 CAPSULE BY MOUTH TWICE DAILY. START OFF TAKING 1 AT BEDTIME FOR 1 WEEK AND MAY INCREASE TO 2 TIMES DAILY Patient not taking: Reported on 06/06/2021 06/03/18   Mercer Clotilda SAUNDERS, MD  hydrOXYzine (ATARAX/VISTARIL) 50 MG tablet Take 100 mg by mouth at bedtime. Patient not taking: Reported on 06/06/2021 03/07/19   [provider]  linaclotide  (LINZESS ) 72 MCG capsule Take 72 mcg by mouth daily as needed (constipation).  Patient not taking: Reported on 06/06/2021  [provider]  losartan -hydrochlorothiazide  (HYZAAR) 100-25 MG tablet Take 1 tablet by mouth daily. 04/30/18   Mercer Clotilda SAUNDERS, MD  omeprazole  (PRILOSEC) 20 MG capsule Take 1 capsule (20 mg total) by mouth daily. Patient not taking: Reported on 06/06/2021 10/04/17   Palumbo, April, MD  oseltamivir  (TAMIFLU ) 75 MG capsule Take 1 capsule (75 mg total) by mouth every 12 (twelve) hours. Patient not taking: Reported on 06/06/2021 01/01/21   Theotis Cameron HERO, PA-C  Oxycodone  HCl 10 MG TABS Take 10 mg by mouth 3 (three) times daily as needed.    [provider]  Peppermint Oil 50 MG CPDR  Take by mouth.    [provider]  psyllium (REGULOID) 0.52 g capsule Take 0.52 g by mouth daily.    [provider]  sucralfate  (CARAFATE ) 1 g tablet Take 1 g by mouth 2 (two) times daily.  Patient not taking: Reported on 06/06/2021 03/19/19   [provider]  SYMBICORT 160-4.5 MCG/ACT inhaler Inhale 2 puffs into the lungs in the morning and at bedtime. 02/11/19   [provider]  traMADol  (ULTRAM ) 50 MG tablet Take 100 mg by mouth 3 (three) times daily. Patient not taking: Reported on 06/06/2021 04/15/19   [provider]  venlafaxine XR (EFFEXOR-XR) 75 MG 24 hr capsule Take 75 mg by mouth daily. Patient not taking: Reported on 06/06/2021 01/09/19   [provider]  zolpidem (AMBIEN) 10 MG tablet Take 10 mg by mouth at bedtime as needed for sleep.    [provider]    Family History  Problem Relation Age of Onset   Congestive Heart Failure Mother    Diabetes Mother    Hypertension Mother    Stroke Father    Cancer Brother        type unknown   Heart disease Sister    Bone cancer Maternal Aunt    Cancer Maternal Uncle        type unknown   Diabetes Sister      Social History   Tobacco Use   Smoking status: Every Day    Current packs/day: 0.50    Average packs/day: 0.5 packs/day for 46.0 years (23.0 ttl pk-yrs)    Types: Cigarettes   Smokeless tobacco: Never  Vaping Use   Vaping status: Never Used  Substance Use Topics   Alcohol use: No   Drug use: Not Currently    Types: Marijuana    Allergies as of 01/30/2024 - Review Complete 01/04/2024  Allergen Reaction Noted   Codeine Nausea Only 05/24/2011    Review of Systems:    All systems reviewed and negative except where noted in HPI.   Physical Exam:  There were no vitals taken for this visit. No LMP recorded. Patient has had an ablation.  General:   Alert,  Well-developed, well-nourished, pleasant and cooperative in NAD Lungs:  Respirations even and  unlabored.  Clear throughout to auscultation.   No wheezes, crackles, or rhonchi. No acute distress. Heart:  Regular rate and rhythm; no murmurs, clicks, rubs, or gallops. Abdomen:  Normal bowel sounds.  No bruits.  Soft, and non-distended without masses, hepatosplenomegaly or hernias noted.  No Tenderness.  No guarding or rebound tenderness.    Neurologic:  Alert and oriented x3;  grossly normal neurologically. Psych:  Alert and cooperative. Normal mood and affect.   Imaging Studies: CT Head Wo Contrast Result Date: 01/04/2024 CLINICAL DATA:  Mental status changes. Visual hallucinations. Paranoia. EXAM: CT HEAD WITHOUT CONTRAST TECHNIQUE:  Contiguous axial images were obtained from the base of the skull through the vertex without intravenous contrast. RADIATION DOSE REDUCTION: This exam was performed according to the departmental dose-optimization program which includes automated exposure control, adjustment of the mA and/or kV according to patient size and/or use of iterative reconstruction technique. COMPARISON:  07/23/2022 FINDINGS: Brain: Exam is markedly motion degraded despite repeat scanning. No gross hemorrhage or hydrocephalus. No midline shift. Areas of acute ischemia and/or subarachnoid hemorrhage could be obscured by motion artifact. Vascular: Limited assessment. Skull: Assessment limited by substantial motion degradation despite repeat scanning. Subtle or nondisplaced skull fractures could be obscured. Sinuses/Orbits: No gross paranasal sinus disease. Evaluation of the orbits is nondiagnostic. Other: None. IMPRESSION: Markedly motion degraded study despite repeat scanning. No gross hemorrhage or hydrocephalus evident. Areas of acute ischemia and/or subarachnoid hemorrhage could be obscured by motion artifact. Repeat study recommended when patient is better able to participate with the exam. Electronically Signed   By: Camellia Candle M.D.   On: 01/04/2024 07:11    Labs: CBC    Component  Value Date/Time   WBC 6.1 01/04/2024 0350   RBC 4.47 01/04/2024 0350   HGB 12.5 01/04/2024 0350   HCT 39.4 01/04/2024 0350   PLT 200 01/04/2024 0350   MCV 88.1 01/04/2024 0350    CMP     Component Value Date/Time   NA 139 01/04/2024 0350   NA 141 06/06/2021 1204   K 3.7 01/04/2024 0350   CL 101 01/04/2024 0350   CO2 15 (L) 01/04/2024 0350   GLUCOSE 110 (H) 01/04/2024 0350   BUN 19 01/04/2024 0350   BUN 11 06/06/2021 1204   CREATININE 1.33 (H) 01/04/2024 0350   CALCIUM  10.0 01/04/2024 0350   PROT 8.1 01/04/2024 0350   ALBUMIN 4.6 01/04/2024 0350   AST 25 01/04/2024 0350   ALT 12 01/04/2024 0350   ALKPHOS 65 01/04/2024 0350   BILITOT 0.5 01/04/2024 0350   GFRNONAA 43 (L) 01/04/2024 0350   GFRAA >60 05/04/2019 0330    Assessment and Plan:   Jill Newton is a 68 y.o. y/o female has been referred for ***  Assessment and Plan Assessment & Plan       Follow up ***  Ellouise Console, PA-C

## 2024-01-30 ENCOUNTER — Ambulatory Visit: Admitting: Physician Assistant

## 2024-03-09 NOTE — Progress Notes (Unsigned)
 "     Jill Console, PA-C [address] [place], [place]  [address] Phone: [phone]   Gastroenterology Consultation  Referring Provider:     Claudene Prentice DELENA Mickey., FNP Primary Care Physician:  Jill Prentice DELENA Mickey., FNP Primary Gastroenterologist:  Jill Console, PA-C / Jill Naval, MD  Reason for Consultation:     Upper abdominal pain, GERD, dysphagia        HPI:   Discussed the use of AI scribe software for clinical note transcription with the patient, who gave verbal consent to proceed.  New patient.  Here to evaluate upper abdominal pain and discuss EGD.  Has mild dysphagia and GERD.  History of chronic epigastric and chest pain for 8 years.  Patient has had several ED visits in the past year to evaluate atypical chest pain.  Last ED visit 01/05/2024 at [facility] [place].  Currently on Abx for URI.  Hx emphysema and chronic pain on chronic opiods.  Smokes cigarettes.  Cardiac evaluation unrevealing.  05/2017 last EGD by Dr. Naval:  Mild gastritis and duodenitis.  Bx negative for H. Pylori and EOE.  05/2017 last colonoscopy:  12 mm tubular adenoma polyp removed from ascending colon.  Diverticulosis.  Fair Prep.  Repeat Colon in 6-12 months.  History of Present Illness Jill Newton is a 69 year old female with gastroesophageal reflux disease and prior colonic polyp who presents for evaluation of chronic severe upper abdominal and chest pain.  Chronic Upper Abdominal and Chest Pain: - Severe pain localized to the upper abdomen and middle chest, radiating to the back - Pain described as being behind the breastplate, not over the heart or in the sternum - Duration of symptoms for 8 years with progressive worsening - Pain now prevents lying on stomach or side and causes significant distress - Expresses severe pain, stating, I'm hurting so bad, and cried during the visit due to pain severity.  She asked me for refill of oxycodone . - No relief with Tylenol ,  gabapentin , or Vicodin - Frustration with lack of effective pain management - Not currently taking any medicine for GERD  Gastroesophageal Reflux Disease and Prior Evaluations: - Upper endoscopy in 2019 revealed acid reflux and mild gastritis - Biopsies for H. pylori and eosinophilic esophagitis were negative - Esophageal manometry performed with a monitor placed through the nose for two days - Cardiology evaluation found no cardiac etiology for pain - No evaluation by pulmonology  Colorectal Neoplasia Surveillance: - Colonoscopy in 2019 with removal of a large precancerous polyp - Advised to repeat colonoscopy in one year  Functional Status and Social Support: - Accompanied by son and a friend who are in the waiting room.     Past Medical History:  Diagnosis Date   Anxiety    Arthritis    Chest pain    COPD (chronic obstructive pulmonary disease) (HCC)    Wheezing, SOB   Depression    Dyspnea    When going up inclines   Edema    In ankles. Since going off meds   Enlarged heart    GERD (gastroesophageal reflux disease)    Hyperlipidemia    Hypertension    Migraine    Palpitations    Peptic ulcer     Past Surgical History:  Procedure Laterality Date   42 HOUR PH STUDY N/A 06/20/2017   Procedure: 24 HOUR PH STUDY;  Surgeon: Newton Jill SQUIBB, MD;  Location: [facility];  Service: Gastroenterology;  Laterality:  N/A;   CARDIAC CATHETERIZATION     ESOPHAGEAL MANOMETRY N/A 06/20/2017   Procedure: ESOPHAGEAL MANOMETRY (EM);  Surgeon: Leigh Jill SQUIBB, MD;  Location: [facility];  Service: Gastroenterology;  Laterality: N/A;   TUBAL LIGATION      Prior to Admission medications  Medication Sig Start Date End Date Taking? Authorizing Provider  albuterol  (PROVENTIL  HFA;VENTOLIN  HFA) 108 (90 Base) MCG/ACT inhaler Inhale 1-2 puffs into the lungs every 6 (six) hours as needed for wheezing or shortness of breath. 10/04/17   Palumbo, April, MD  ALPRAZolam  (XANAX ) 1 MG tablet  Take 1 mg by mouth 3 (three) times daily. 04/13/19   [provider]  atorvastatin  (LIPITOR) 10 MG tablet TAKE 1 TABLET(10 MG) BY MOUTH DAILY Patient taking differently: Take 10 mg by mouth daily at 6 PM. 12/28/17   Mercer Clotilda SAUNDERS, MD  bacitracin -polymyxin b  (POLYSPORIN ) ophthalmic ointment Place 1 Application into the right eye every 12 (twelve) hours. while awake 08/30/22   Myrna Camelia HERO, NP  famotidine  (PEPCID ) 20 MG tablet Take 20 mg by mouth daily as needed for heartburn or indigestion.    [provider]  gabapentin  (NEURONTIN ) 300 MG capsule TAKE 1 CAPSULE BY MOUTH TWICE DAILY. START OFF TAKING 1 AT BEDTIME FOR 1 WEEK AND MAY INCREASE TO 2 TIMES DAILY Patient not taking: Reported on 06/06/2021 06/03/18   Mercer Clotilda SAUNDERS, MD  hydrOXYzine (ATARAX/VISTARIL) 50 MG tablet Take 100 mg by mouth at bedtime. Patient not taking: Reported on 06/06/2021 03/07/19   [provider]  linaclotide  (LINZESS ) 72 MCG capsule Take 72 mcg by mouth daily as needed (constipation).  Patient not taking: Reported on 06/06/2021    [provider]  losartan -hydrochlorothiazide  (HYZAAR) 100-25 MG tablet Take 1 tablet by mouth daily. 04/30/18   Mercer Clotilda SAUNDERS, MD  omeprazole  (PRILOSEC) 20 MG capsule Take 1 capsule (20 mg total) by mouth daily. Patient not taking: Reported on 06/06/2021 10/04/17   Palumbo, April, MD  oseltamivir  (TAMIFLU ) 75 MG capsule Take 1 capsule (75 mg total) by mouth every 12 (twelve) hours. Patient not taking: Reported on 06/06/2021 01/01/21   Theotis Cameron HERO, PA-C  Oxycodone  HCl 10 MG TABS Take 10 mg by mouth 3 (three) times daily as needed.    [provider]  Peppermint Oil 50 MG CPDR Take by mouth.    [provider]  psyllium (REGULOID) 0.52 g capsule Take 0.52 g by mouth daily.    [provider]  sucralfate  (CARAFATE ) 1 g tablet Take 1 g by mouth 2 (two) times daily.  Patient not taking: Reported on 06/06/2021 03/19/19   [provider]  SYMBICORT 160-4.5 MCG/ACT inhaler Inhale 2 puffs into the lungs in the morning and at bedtime. 02/11/19   [provider]  traMADol  (ULTRAM ) 50 MG tablet Take 100 mg by mouth 3 (three) times daily. Patient not taking: Reported on 06/06/2021 04/15/19   [provider]  venlafaxine XR (EFFEXOR-XR) 75 MG 24 hr capsule Take 75 mg by mouth daily. Patient not taking: Reported on 06/06/2021 01/09/19   [provider]  zolpidem (AMBIEN) 10 MG tablet Take 10 mg by mouth at bedtime as needed for sleep.    [provider]    Family History  Problem Relation Age of Onset   Congestive Heart Failure Mother    Diabetes Mother    Hypertension Mother    Stroke Father    Cancer Brother        type unknown  Heart disease Sister    Bone cancer Maternal Aunt    Cancer Maternal Uncle        type unknown   Diabetes Sister      Social History[1]  Allergies as of 03/10/2024 - Review Complete 03/10/2024  Allergen Reaction Noted   Codeine Nausea Only 05/24/2011    Review of Systems:    All systems reviewed and negative except where noted in HPI.   Physical Exam:  BP 110/70 (BP Location: Left Arm, Patient Position: Sitting, Cuff Size: Large)   Pulse 60   Ht 5' 3.5 (1.613 m) Comment: height measured without shoes  Wt 210 lb 2 oz (95.3 kg)   SpO2 96% Comment: On Room Air  BMI 36.64 kg/m  No LMP recorded. Patient has had an ablation.  General:   Alert,  Well-developed, well-nourished, pleasant and cooperative in NAD Lungs:  Respirations even and unlabored.  Clear throughout to auscultation.   No wheezes, crackles, or rhonchi. No acute distress. Heart:  Regular rate and rhythm; no murmurs, clicks, rubs, or gallops. Abdomen:  Normal bowel sounds.  No bruits.  Soft, and non-distended without masses, hepatosplenomegaly or hernias noted.  Moderate epigastric and RUQ tenderness.  No guarding or rebound tenderness.    Neurologic:  Alert and oriented x3;   grossly normal neurologically. Psych:  Alert and cooperative.  Depressed, tearful mood and affect.   Imaging Studies: No results found.  Labs: CBC    Component Value Date/Time   WBC 6.1 01/04/2024 0350   RBC 4.47 01/04/2024 0350   HGB 12.5 01/04/2024 0350   HCT 39.4 01/04/2024 0350   PLT 200 01/04/2024 0350   MCV 88.1 01/04/2024 0350    CMP     Component Value Date/Time   NA 139 01/04/2024 0350   NA 141 06/06/2021 1204   K 3.7 01/04/2024 0350   CL 101 01/04/2024 0350   CO2 15 (L) 01/04/2024 0350   GLUCOSE 110 (H) 01/04/2024 0350   BUN 19 01/04/2024 0350   BUN 11 06/06/2021 1204   CREATININE 1.33 (H) 01/04/2024 0350   CALCIUM  10.0 01/04/2024 0350   PROT 8.1 01/04/2024 0350   ALBUMIN 4.6 01/04/2024 0350   AST 25 01/04/2024 0350   ALT 12 01/04/2024 0350   ALKPHOS 65 01/04/2024 0350   BILITOT 0.5 01/04/2024 0350   GFRNONAA 43 (L) 01/04/2024 0350   GFRAA >60 05/04/2019 0330    Assessment and Plan:   Jill Newton is a 69 y.o. y/o female has been referred for: Assessment & Plan 1.  Epigastric and chest pain evaluation Chronic and intermittent for 8 years., Severe, refractory upper abdominal and midline chest pain for eight years, non-cardiac in origin, with significant impact on quality of life and distress. Etiology remains unclear and requires further evaluation. - Ordered upper endoscopy (EGD) to evaluate esophageal and gastric causes of pain. - I discussed risks of EGD with patient to include risk of bleeding, perforation, and risk of sedation.  Patient expressed understanding and agrees to proceed with EGD.   2.  Gastroesophageal reflux disease Acid reflux confirmed by prior endoscopy with ongoing symptoms. She is skeptical of this as the pain source but agreed to a therapeutic trial after education regarding delayed onset of symptom relief and importance of adherence. - Prescribed Pantoprazole  40mg  twice daily on a regular basis, #60, 11 RF. -  Scheduled follow-up after procedures to reassess response to therapy.  3.  Personal history of colon polyps Large precancerous colonic polyp  removed in 2019. She is overdue for repeat colonoscopy, which is indicated due to increased risk for new polyps and colon cancer.  - Scheduled repeat colonoscopy with two-day bowel preparation. - I discussed risks of colonoscopy with patient to include risk of bleeding, colon perforation, and risk of sedation.  Patient expressed understanding and agrees to proceed with colonoscopy.  - Educated her on the importance of colonoscopy for prevention of colon cancer and removal of precancerous polyps. - 2 day prep given  4.  Chronic Constipation - Increase Linzess  from 72 to 145mcg daily, #30, 11 RF.   Follow up 4 weeks after EGD and colonoscopy with TG.  Jill Console, PA-C       [1]  Social History Tobacco Use   Smoking status: Every Day    Current packs/day: 0.50    Average packs/day: 0.5 packs/day for 46.0 years (23.0 ttl pk-yrs)    Types: Cigarettes   Smokeless tobacco: Never  Vaping Use   Vaping status: Never Used  Substance Use Topics   Alcohol use: No   Drug use: Not Currently    Types: Marijuana   "

## 2024-03-10 ENCOUNTER — Ambulatory Visit: Payer: Medicare (Managed Care) | Admitting: Physician Assistant

## 2024-03-10 ENCOUNTER — Encounter: Payer: Self-pay | Admitting: Physician Assistant

## 2024-03-10 VITALS — BP 110/70 | HR 60 | Ht 63.5 in | Wt 210.1 lb

## 2024-03-10 DIAGNOSIS — Z860101 Personal history of adenomatous and serrated colon polyps: Secondary | ICD-10-CM | POA: Diagnosis not present

## 2024-03-10 DIAGNOSIS — G8929 Other chronic pain: Secondary | ICD-10-CM | POA: Diagnosis not present

## 2024-03-10 DIAGNOSIS — K219 Gastro-esophageal reflux disease without esophagitis: Secondary | ICD-10-CM | POA: Diagnosis not present

## 2024-03-10 DIAGNOSIS — R1013 Epigastric pain: Secondary | ICD-10-CM | POA: Diagnosis not present

## 2024-03-10 DIAGNOSIS — R0789 Other chest pain: Secondary | ICD-10-CM | POA: Diagnosis not present

## 2024-03-10 DIAGNOSIS — R1310 Dysphagia, unspecified: Secondary | ICD-10-CM

## 2024-03-10 DIAGNOSIS — Z860100 Personal history of colon polyps, unspecified: Secondary | ICD-10-CM

## 2024-03-10 DIAGNOSIS — K5909 Other constipation: Secondary | ICD-10-CM

## 2024-03-10 DIAGNOSIS — K5904 Chronic idiopathic constipation: Secondary | ICD-10-CM

## 2024-03-10 MED ORDER — LINACLOTIDE 145 MCG PO CAPS: 145.0000 ug | ORAL_CAPSULE | Freq: Every day | ORAL | 11 refills | Status: AC

## 2024-03-10 MED ORDER — PANTOPRAZOLE SODIUM 40 MG PO TBEC: 40.0000 mg | DELAYED_RELEASE_TABLET | Freq: Two times a day (BID) | ORAL | 11 refills | Status: AC

## 2024-03-10 MED ORDER — NA SULFATE-K SULFATE-MG SULF 17.5-3.13-1.6 GM/177ML PO SOLN: 1.0000 | Freq: Once | ORAL | 0 refills | Status: AC

## 2024-03-10 NOTE — Patient Instructions (Signed)
 We have sent the following medications to your pharmacy for you to pick up at your convenience: Pantoprazole  40 mg twice a day.  You have been scheduled for an endoscopy and colonoscopy. Please follow the written instructions given to you at your visit today.  If you use inhalers (even only as needed), please bring them with you on the day of your procedure.  DO NOT TAKE 7 DAYS PRIOR TO TEST- Trulicity (dulaglutide) Ozempic, Wegovy (semaglutide) Mounjaro, Zepbound (tirzepatide) Bydureon Bcise (exanatide extended release)  DO NOT TAKE 1 DAY PRIOR TO YOUR TEST Rybelsus (semaglutide) Adlyxin (lixisenatide) Victoza (liraglutide) Byetta (exanatide) ___________________________________________________________________________ Thank you for trusting me with your gastrointestinal care!   Ellouise Console, PA-C  _______________________________________________________  If your blood pressure at your visit was 140/90 or greater, please contact your primary care physician to follow up on this.  _______________________________________________________  If you are age 22 or older, your body mass index should be between 23-30. Your Body mass index is 36.64 kg/m. If this is out of the aforementioned range listed, please consider follow up with your Primary Care Provider.  If you are age 53 or younger, your body mass index should be between 19-25. Your Body mass index is 36.64 kg/m. If this is out of the aformentioned range listed, please consider follow up with your Primary Care Provider.   ________________________________________________________  The  GI providers would like to encourage you to use MYCHART to communicate with providers for non-urgent requests or questions.  Due to long hold times on the telephone, sending your provider a message by [facility] may be a faster and more efficient way to get a response.  Please allow 48 business hours for a response.  Please remember that this is for  non-urgent requests.  _______________________________________________________  [facility] is using a team-based approach to care.  Your team is made up of your doctor and two to three APPS. Our APPS (Nurse Practitioners and Physician Assistants) work with your physician to ensure care continuity for you. They are fully qualified to address your health concerns and develop a treatment plan. They communicate directly with your gastroenterologist to care for you. Seeing the Advanced Practice Practitioners on your physician's team can help you by facilitating care more promptly, often allowing for earlier appointments, access to diagnostic testing, procedures, and other specialty referrals.

## 2024-03-10 NOTE — Progress Notes (Signed)
 Agree with assessment and plan as outlined.

## 2024-04-08 ENCOUNTER — Encounter: Payer: Medicare (Managed Care) | Admitting: Gastroenterology
